# Patient Record
Sex: Female | Born: 1980 | Race: Black or African American | Hispanic: No | Marital: Married | Smoking: Never smoker
Health system: Southern US, Community
[De-identification: ages and names within clinical notes are randomized; demographics above are authoritative.]

## PROBLEM LIST (undated history)

## (undated) DIAGNOSIS — L900 Lichen sclerosus et atrophicus: Secondary | ICD-10-CM

## (undated) DIAGNOSIS — R87619 Unspecified abnormal cytological findings in specimens from cervix uteri: Secondary | ICD-10-CM

## (undated) DIAGNOSIS — F419 Anxiety disorder, unspecified: Secondary | ICD-10-CM

## (undated) DIAGNOSIS — O2240 Hemorrhoids in pregnancy, unspecified trimester: Secondary | ICD-10-CM

## (undated) DIAGNOSIS — D219 Benign neoplasm of connective and other soft tissue, unspecified: Secondary | ICD-10-CM

## (undated) HISTORY — DX: Hemorrhoids in pregnancy, unspecified trimester: O22.40

## (undated) HISTORY — DX: Unspecified abnormal cytological findings in specimens from cervix uteri: R87.619

## (undated) HISTORY — DX: Anxiety disorder, unspecified: F41.9

## (undated) HISTORY — DX: Lichen sclerosus et atrophicus: L90.0

## (undated) HISTORY — DX: Benign neoplasm of connective and other soft tissue, unspecified: D21.9

---
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
## 2012-04-20 ENCOUNTER — Other Ambulatory Visit: Payer: Self-pay | Admitting: Certified Nurse Midwife

## 2012-04-20 DIAGNOSIS — R87619 Unspecified abnormal cytological findings in specimens from cervix uteri: Secondary | ICD-10-CM

## 2012-04-20 HISTORY — DX: Unspecified abnormal cytological findings in specimens from cervix uteri: R87.619

## 2012-04-26 ENCOUNTER — Telehealth: Payer: Self-pay | Admitting: *Deleted

## 2012-04-26 NOTE — Telephone Encounter (Signed)
See pap result prior to EPIC.  Colpo recommended by D. Darcel Bayley.  Call to patient and left message to call back.

## 2012-05-07 ENCOUNTER — Telehealth: Payer: Self-pay | Admitting: *Deleted

## 2012-05-07 NOTE — Telephone Encounter (Signed)
2nd call to patient with lab results.  LMTCB/sy

## 2012-05-08 NOTE — Telephone Encounter (Signed)
3rd call to patient for lab results, VM confirms correct number. LM calling about lab results and to please call.

## 2012-05-10 NOTE — Telephone Encounter (Signed)
Patient notified of lab results and Pap results.  Explained Debbi's recommendation for colposcopy.  Patient just finished menses and is on Nuvaring.  Colpo scheduled for 05-15-12.  Instructed to take Motrin 800 mg 1 hour prior with food.

## 2012-05-10 NOTE — Telephone Encounter (Signed)
agree

## 2012-05-14 ENCOUNTER — Encounter: Payer: Self-pay | Admitting: Certified Nurse Midwife

## 2012-05-15 ENCOUNTER — Encounter: Payer: Self-pay | Admitting: Certified Nurse Midwife

## 2012-05-15 ENCOUNTER — Ambulatory Visit (INDEPENDENT_AMBULATORY_CARE_PROVIDER_SITE_OTHER): Payer: BC Managed Care – PPO | Admitting: Certified Nurse Midwife

## 2012-05-15 VITALS — BP 100/60

## 2012-05-15 DIAGNOSIS — R87610 Atypical squamous cells of undetermined significance on cytologic smear of cervix (ASC-US): Secondary | ICD-10-CM

## 2012-05-15 DIAGNOSIS — Z308 Encounter for other contraceptive management: Secondary | ICD-10-CM

## 2012-05-15 DIAGNOSIS — R87619 Unspecified abnormal cytological findings in specimens from cervix uteri: Secondary | ICD-10-CM

## 2012-05-15 DIAGNOSIS — Z789 Other specified health status: Secondary | ICD-10-CM

## 2012-05-15 NOTE — Progress Notes (Signed)
Reviewed, TL colposcopy

## 2012-05-15 NOTE — Patient Instructions (Addendum)

## 2012-05-15 NOTE — Procedures (Signed)
32 y.o. DivorcedAfrican Americanfemale  OB History   Grav Para Term Preterm Abortions TAB SAB Ect Mult Living   2    2 2     0     here for colposcopy. 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  Contraception:  NuvaRing vaginal inserts   Blood pressure 100/60, last menstrual period 04/30/2012.  Procedure explained and patient's questions were invited and answered.  Consent form signed.    Role of HPV in genesis of SIL discussed with patient, and questions answered.   Speculum inserted atraumatically and cervix visualized. Saline wash applied to cervix and vagina,  3% acetic acid applied.  Cervix examined using 3.75,7.5,15  X magnification and green filter. No acetowhite appearance noted. Lugol's applied to cervix and vaginal area, all areas took stain, no lesions noted.     Gross appearance:normal  Squamocolumnar junction seen in entirety: yes  Extent of lesion entirely seen: no lesion seen   no visible lesions and no abnormal vasculature, endocervical curettage performed, specimen labelled and sent to pathology and hemostasis achieved with Monsel's solution  Patient tolerated procedure well.   Plan:  Will base further treatment on Pathology findings.  Post biopsy instructions, questions answered and AVS given to patient.

## 2012-05-17 ENCOUNTER — Encounter: Payer: Self-pay | Admitting: Certified Nurse Midwife

## 2012-05-21 ENCOUNTER — Telehealth: Payer: Self-pay

## 2012-05-21 NOTE — Telephone Encounter (Signed)
Calling to give patient pathology results from colposcopy

## 2012-05-29 ENCOUNTER — Encounter: Payer: Self-pay | Admitting: Certified Nurse Midwife

## 2012-05-29 NOTE — Telephone Encounter (Signed)
Left message for call back.

## 2012-05-29 NOTE — Telephone Encounter (Signed)
[phone] (M) call mobile number for patient

## 2012-05-29 NOTE — Telephone Encounter (Signed)
Patient notified that ecc showed chronic inflammation, no atypical cells. Repeat pap with cotesting in 1 yr. Patient in recall for pap

## 2013-04-23 ENCOUNTER — Ambulatory Visit (INDEPENDENT_AMBULATORY_CARE_PROVIDER_SITE_OTHER): Payer: BC Managed Care – PPO | Admitting: Certified Nurse Midwife

## 2013-04-23 ENCOUNTER — Encounter: Payer: Self-pay | Admitting: Certified Nurse Midwife

## 2013-04-23 VITALS — BP 124/80 | HR 78 | Resp 16 | Ht 64.0 in | Wt 151.0 lb

## 2013-04-23 DIAGNOSIS — N870 Mild cervical dysplasia: Secondary | ICD-10-CM

## 2013-04-23 DIAGNOSIS — Z0000 Encounter for general adult medical examination without abnormal findings: Secondary | ICD-10-CM

## 2013-04-23 DIAGNOSIS — Z309 Encounter for contraceptive management, unspecified: Secondary | ICD-10-CM

## 2013-04-23 DIAGNOSIS — Z01419 Encounter for gynecological examination (general) (routine) without abnormal findings: Secondary | ICD-10-CM

## 2013-04-23 LAB — POCT URINALYSIS DIPSTICK
BILIRUBIN UA: NEGATIVE
Blood, UA: NEGATIVE
GLUCOSE UA: NEGATIVE
Ketones, UA: NEGATIVE
LEUKOCYTES UA: NEGATIVE
NITRITE UA: NEGATIVE
Protein, UA: NEGATIVE
UROBILINOGEN UA: NEGATIVE
pH, UA: 5

## 2013-04-23 LAB — HEMOGLOBIN, FINGERSTICK: Hemoglobin, fingerstick: 12.4 g/dL (ref 12.0–16.0)

## 2013-04-23 MED ORDER — ETONOGESTREL-ETHINYL ESTRADIOL 0.12-0.015 MG/24HR VA RING: VAGINAL_RING | VAGINAL | Status: DC

## 2013-04-23 NOTE — Patient Instructions (Signed)
General topics  Next pap or exam is  due in 1 year Take a Women's multivitamin Take 1200 mg. of calcium daily - prefer dietary If any concerns in interim to call back  Breast Self-Awareness Practicing breast self-awareness may pick up problems early, prevent significant medical complications, and possibly save your life. By practicing breast self-awareness, you can become familiar with how your breasts look and feel and if your breasts are changing. This allows you to notice changes early. It can also offer you some reassurance that your breast health is good. One way to learn what is normal for your breasts and whether your breasts are changing is to do a breast self-exam. If you find a lump or something that was not present in the past, it is best to contact your caregiver right away. Other findings that should be evaluated by your caregiver include nipple discharge, especially if it is bloody; skin changes or reddening; areas where the skin seems to be pulled in (retracted); or new lumps and bumps. Breast pain is seldom associated with cancer (malignancy), but should also be evaluated by a caregiver. BREAST SELF-EXAM The best time to examine your breasts is 5 7 days after your menstrual period is over.  ExitCare Patient Information 2013 [employer].   Exercise to Stay Healthy Exercise helps you become and stay healthy. EXERCISE IDEAS AND TIPS Choose exercises that:  You enjoy.  Fit into your day. You do not need to exercise really hard to be healthy. You can do exercises at a slow or medium level and stay healthy. You can:  Stretch before and after working out.  Try yoga, Pilates, or tai chi.  Lift weights.  Walk fast, swim, jog, run, climb stairs, bicycle, dance, or rollerskate.  Take aerobic classes. Exercises that burn about 150 calories:  Running 1  miles in 15 minutes.  Playing volleyball for 45 to 60 minutes.  Washing and waxing a car for 45 to 60  minutes.  Playing touch football for 45 minutes.  Walking 1  miles in 35 minutes.  Pushing a stroller 1  miles in 30 minutes.  Playing basketball for 30 minutes.  Raking leaves for 30 minutes.  Bicycling 5 miles in 30 minutes.  Walking 2 miles in 30 minutes.  Dancing for 30 minutes.  Shoveling snow for 15 minutes.  Swimming laps for 20 minutes.  Walking up stairs for 15 minutes.  Bicycling 4 miles in 15 minutes.  Gardening for 30 to 45 minutes.  Jumping rope for 15 minutes.  Washing windows or floors for 45 to 60 minutes. Document Released: 02/26/2010 Document Revised: 04/18/2011 Document Reviewed: 02/26/2010 ExitCare Patient Information 2013 [employer].   Other topics ( that may be useful information):    Sexually Transmitted Disease Sexually transmitted disease (STD) refers to any infection that is passed from person to person during sexual activity. This may happen by way of saliva, semen, blood, vaginal mucus, or urine. Common STDs include:  Gonorrhea.  Chlamydia.  Syphilis.  HIV/AIDS.  Genital herpes.  Hepatitis B and C.  Trichomonas.  Human papillomavirus (HPV).  Pubic lice. CAUSES  An STD may be spread by bacteria, virus, or parasite. A person can get an STD by:  Sexual intercourse with an infected person.  Sharing sex toys with an infected person.  Sharing needles with an infected person.  Having intimate contact with the genitals, mouth, or rectal areas of an infected person. SYMPTOMS  Some people may not have any symptoms, but   they can still pass the infection to others. Different STDs have different symptoms. Symptoms include:  Painful or bloody urination.  Pain in the pelvis, abdomen, vagina, anus, throat, or eyes.  Skin rash, itching, irritation, growths, or sores (lesions). These usually occur in the genital or anal area.  Abnormal vaginal discharge.  Penile discharge in men.  Soft, flesh-colored skin growths in the  genital or anal area.  Fever.  Pain or bleeding during sexual intercourse.  Swollen glands in the groin area.  Yellow skin and eyes (jaundice). This is seen with hepatitis. DIAGNOSIS  To make a diagnosis, your caregiver may:  Take a medical history.  Perform a physical exam.  Take a specimen (culture) to be examined.  Examine a sample of discharge under a microscope.  Perform blood test TREATMENT   Chlamydia, gonorrhea, trichomonas, and syphilis can be cured with antibiotic medicine.  Genital herpes, hepatitis, and HIV can be treated, but not cured, with prescribed medicines. The medicines will lessen the symptoms.  Genital warts from HPV can be treated with medicine or by freezing, burning (electrocautery), or surgery. Warts may come back.  HPV is a virus and cannot be cured with medicine or surgery.However, abnormal areas may be followed very closely by your caregiver and may be removed from the cervix, vagina, or vulva through office procedures or surgery. If your diagnosis is confirmed, your recent sexual partners need treatment. This is true even if they are symptom-free or have a negative culture or evaluation. They should not have sex until their caregiver says it is okay. HOME CARE INSTRUCTIONS  All sexual partners should be informed, tested, and treated for all STDs.  Take your antibiotics as directed. Finish them even if you start to feel better.  Only take over-the-counter or prescription medicines for pain, discomfort, or fever as directed by your caregiver.  Rest.  Eat a balanced diet and drink enough fluids to keep your urine clear or pale yellow.  Do not have sex until treatment is completed and you have followed up with your caregiver. STDs should be checked after treatment.  Keep all follow-up appointments, Pap tests, and blood tests as directed by your caregiver.  Only use latex condoms and water-soluble lubricants during sexual activity. Do not use  petroleum jelly or oils.  Avoid alcohol and illegal drugs.  Get vaccinated for HPV and hepatitis. If you have not received these vaccines in the past, talk to your caregiver about whether one or both might be right for you.  Avoid risky sex practices that can break the skin. The only way to avoid getting an STD is to avoid all sexual activity.Latex condoms and dental dams (for oral sex) will help lessen the risk of getting an STD, but will not completely eliminate the risk. SEEK MEDICAL CARE IF:   You have a fever.  You have any new or worsening symptoms. Document Released: 04/16/2002 Document Revised: 04/18/2011 Document Reviewed: 04/23/2010 [facility] Patient Information 2013 [place].    Domestic Abuse You are being battered or abused if someone close to you hits, pushes, or physically hurts you in any way. You also are being abused if you are forced into activities. You are being sexually abused if you are forced to have sexual contact of any kind. You are being emotionally abused if you are made to feel worthless or if you are constantly threatened. It is important to remember that help is available. No one has the right to abuse you. PREVENTION OF FURTHER  ABUSE  Learn the warning signs of danger. This varies with situations but may include: the use of alcohol, threats, isolation from friends and family, or forced sexual contact. Leave if you feel that violence is going to occur.  If you are attacked or beaten, report it to the police so the abuse is documented. You do not have to press charges. The police can protect you while you or the attackers are leaving. Get the officer's name and badge number and a copy of the report.  Find someone you can trust and tell them what is happening to you: your caregiver, a nurse, clergy member, close friend or family member. Feeling ashamed is natural, but remember that you have done nothing wrong. No one deserves abuse. Document Released:  01/22/2000 Document Revised: 04/18/2011 Document Reviewed: 04/01/2010 ExitCare Patient Information 2013 [employer].    How Much is Too Much Alcohol? Drinking too much alcohol can cause injury, accidents, and health problems. These types of problems can include:   Car crashes.  Falls.  Family fighting (domestic violence).  Drowning.  Fights.  Injuries.  Burns.  Damage to certain organs.  Having a baby with birth defects. ONE DRINK CAN BE TOO MUCH WHEN YOU ARE:  Working.  Pregnant or breastfeeding.  Taking medicines. Ask your doctor.  Driving or planning to drive. If you or someone you know has a drinking problem, get help from a doctor.  Document Released: 11/20/2008 Document Revised: 04/18/2011 Document Reviewed: 11/20/2008 ExitCare Patient Information 2013 [employer].   Smoking Hazards Smoking cigarettes is extremely bad for your health. Tobacco smoke has over 200 known poisons in it. There are over 60 chemicals in tobacco smoke that cause cancer. Some of the chemicals found in cigarette smoke include:   Cyanide.  Benzene.  Formaldehyde.  Methanol (wood alcohol).  Acetylene (fuel used in welding torches).  Ammonia. Cigarette smoke also contains the poisonous gases nitrogen oxide and carbon monoxide.  Cigarette smokers have an increased risk of many serious medical problems and Smoking causes approximately:  90% of all lung cancer deaths in men.  80% of all lung cancer deaths in women.  90% of deaths from chronic obstructive lung disease. Compared with nonsmokers, smoking increases the risk of:  Coronary heart disease by 2 to 4 times.  Stroke by 2 to 4 times.  Men developing lung cancer by 23 times.  Women developing lung cancer by 13 times.  Dying from chronic obstructive lung diseases by 12 times.  . Smoking is the most preventable cause of death and disease in our society.  WHY IS SMOKING ADDICTIVE?  Nicotine is the chemical  agent in tobacco that is capable of causing addiction or dependence.  When you smoke and inhale, nicotine is absorbed rapidly into the bloodstream through your lungs. Nicotine absorbed through the lungs is capable of creating a powerful addiction. Both inhaled and non-inhaled nicotine may be addictive.  Addiction studies of cigarettes and spit tobacco show that addiction to nicotine occurs mainly during the teen years, when young people begin using tobacco products. WHAT ARE THE BENEFITS OF QUITTING?  There are many health benefits to quitting smoking.   Likelihood of developing cancer and heart disease decreases. Health improvements are seen almost immediately.  Blood pressure, pulse rate, and breathing patterns start returning to normal soon after quitting. QUITTING SMOKING   American Lung Association - 1-800-LUNGUSA  American Cancer Society - 1-800-ACS-2345 Document Released: 03/03/2004 Document Revised: 04/18/2011 Document Reviewed: 11/05/2008 ExitCare Patient Information 2013 ExitCare[employer]   LL[employer].   Stress Management Stress is a state of physical or mental tension that often results from changes in your life or normal routine. Some common causes of stress are:  Death of a loved one.  Injuries or severe illnesses.  Getting fired or changing jobs.  Moving into a new home. Other causes may be:  Sexual problems.  Business or financial losses.  Taking on a large debt.  Regular conflict with someone at home or at work.  Constant tiredness from lack of sleep. It is not just bad things that are stressful. It may be stressful to:  Win the lottery.  Get married.  Buy a new car. The amount of stress that can be easily tolerated varies from person to person. Changes generally cause stress, regardless of the types of change. Too much stress can affect your health. It may lead to physical or emotional problems. Too little stress (boredom) may also become stressful. SUGGESTIONS TO  REDUCE STRESS:  Talk things over with your family and friends. It often is helpful to share your concerns and worries. If you feel your problem is serious, you may want to get help from a professional counselor.  Consider your problems one at a time instead of lumping them all together. Trying to take care of everything at once may seem impossible. List all the things you need to do and then start with the most important one. Set a goal to accomplish 2 or 3 things each day. If you expect to do too many in a single day you will naturally fail, causing you to feel even more stressed.  Do not use alcohol or drugs to relieve stress. Although you may feel better for a short time, they do not remove the problems that caused the stress. They can also be habit forming.  Exercise regularly - at least 3 times per week. Physical exercise can help to relieve that "uptight" feeling and will relax you.  The shortest distance between despair and hope is often a good night's sleep.  Go to bed and get up on time allowing yourself time for appointments without being rushed.  Take a short "time-out" period from any stressful situation that occurs during the day. Close your eyes and take some deep breaths. Starting with the muscles in your face, tense them, hold it for a few seconds, then relax. Repeat this with the muscles in your neck, shoulders, hand, stomach, back and legs.  Take good care of yourself. Eat a balanced diet and get plenty of rest.  Schedule time for having fun. Take a break from your daily routine to relax. HOME CARE INSTRUCTIONS   Call if you feel overwhelmed by your problems and feel you can no longer manage them on your own.  Return immediately if you feel like hurting yourself or someone else. Document Released: 07/20/2000 Document Revised: 04/18/2011 Document Reviewed: 03/12/2007 [facility] Patient Information 2013 [place].    It was good to see you today. Enjoy your trip to  [place]!! Debbi

## 2013-04-23 NOTE — Progress Notes (Signed)
33 y.o. G2P0020 Divorced African American Fe here for annual exam.Periods normal, no issues. Contraception working well. No partner change, no STD screening needed. No health issues today. Sees Urgent care if needed.     Patient's last menstrual period was 04/17/2013.          Sexually active: yes  The current method of family planning is NuvaRing vaginal inserts.    Exercising: no  exercise Smoker:  no  Health Maintenance: Pap:  04-20-12 ASCUS +HPV, colpo 05-15-12 ecc chronic inflammation MMG: none Colonoscopy:  none BMD:   none TDaP: unsure ? Up to date Labs: Poct urine-neg, Hgb-12.4 Self breast exam: done monthly   reports that she has never smoked. She does not have any smokeless tobacco history on file. She reports that she does not drink alcohol or use illicit drugs.  Past Medical History  Diagnosis Date  . Abnormal Pap smear of cervix 04-20-12    ASCUS +HPV HR    Past Surgical History  Procedure Laterality Date  . Colposcopy  05-15-13    ecc showed chronic inflammation    Current Outpatient Prescriptions  Medication Sig Dispense Refill  . BIOTIN PO Take by mouth daily.       . Cetirizine HCl (ZYRTEC PO) Take by mouth daily.       Marland Kitchen etonogestrel-ethinyl estradiol (NUVARING) 0.12-0.015 MG/24HR vaginal ring Place 1 each vaginally every 28 (twenty-eight) days. Insert vaginally and leave in place for 3 consecutive weeks, then remove for 1 week.       No current facility-administered medications for this visit.    Family History  Problem Relation Age of Onset  . Breast cancer Mother 15  . Diabetes Mother   . Hypertension Father   . Aneurysm Father   . Diabetes Paternal Grandmother   . Stroke Paternal Grandmother     ROS:  Pertinent items are noted in HPI.  Otherwise, a comprehensive ROS was negative.  Exam:   BP 124/80  Pulse 78  Resp 16  Ht 5' 1.25" (1.556 m)  Wt 151 lb (68.493 kg)  BMI 28.29 kg/m2  LMP 04/17/2013 Height: 5' 1.25" (155.6 cm)  Ht Readings from  Last 3 Encounters:  04/23/13 5' 1.25" (1.556 m)    General appearance: alert, cooperative and appears stated age Head: Normocephalic, without obvious abnormality, atraumatic Neck: no adenopathy, supple, symmetrical, trachea midline and thyroid normal to inspection and palpation and non-palpable Lungs: clear to auscultation bilaterally Breasts: normal appearance, no masses or tenderness, No nipple retraction or dimpling, No nipple discharge or bleeding, No axillary or supraclavicular adenopathy Heart: regular rate and rhythm Abdomen: soft, non-tender; no masses,  no organomegaly Extremities: extremities normal, atraumatic, no cyanosis or edema Skin: Skin color, texture, turgor normal. No rashes or lesions Lymph nodes: Cervical, supraclavicular, and axillary nodes normal. No abnormal inguinal nodes palpated Neurologic: Grossly normal   Pelvic: External genitalia:  no lesions              Urethra:  normal appearing urethra with no masses, tenderness or lesions              Bartholin's and Skene's: normal                 Vagina: normal appearing vagina with normal color and discharge, no lesions              Cervix: normal appearance, non tender              Pap taken: yes Bimanual  Exam:  Uterus:  normal size, contour, position, consistency, mobility, non-tender and anteverted              Adnexa: normal adnexa and no mass, fullness, tenderness               Rectovaginal: Confirms               Anus:  normal sphincter tone, no lesions  A:  Well Woman with normal exam  Contraception Nuvaring desired  History of abnormal pap ASCUS, Colpo CIN1  P:   Reviewed health and wellness pertinent to exam  Rx Nuvaring see order  Pap smear as per guidelines   pap smear taken today with HPVHR if negative repeat pap in one year, otherwise manage per results  counseled on breast self exam, STD prevention, HIV risk factors and prevention, adequate intake of calcium and vitamin D, diet and  exercise  return annually or prn  An After Visit Summary was printed and given to the patient.

## 2013-04-26 LAB — IPS PAP TEST WITH HPV

## 2013-04-26 NOTE — Progress Notes (Signed)
Reviewed personally.  M. Suzanne Shanyia Stines, MD.  

## 2013-04-29 ENCOUNTER — Telehealth: Payer: Self-pay | Admitting: Certified Nurse Midwife

## 2013-04-29 ENCOUNTER — Other Ambulatory Visit: Payer: Self-pay | Admitting: Certified Nurse Midwife

## 2013-04-29 DIAGNOSIS — R87610 Atypical squamous cells of undetermined significance on cytologic smear of cervix (ASC-US): Secondary | ICD-10-CM

## 2013-04-29 DIAGNOSIS — R87810 Cervical high risk human papillomavirus (HPV) DNA test positive: Principal | ICD-10-CM

## 2013-04-29 NOTE — Telephone Encounter (Signed)
Message copied by Jasmine Awe on Mon Apr 29, 2013 10:46 AM ------      Message from: Regina Eck      Created: Mon Apr 29, 2013  8:11 AM       Notify patient pap smear showing same, has not cleared . ASCUS + HPVHR She will need another colposcopy, as we discussed would be the case if still abnormal.      Order in ------

## 2013-04-29 NOTE — Telephone Encounter (Signed)
Patient returning Kaitlyn's call. °

## 2013-04-29 NOTE — Telephone Encounter (Signed)
Spoke with patient. Message from Debbi given. Patient agreeable to schedule colpo at this time. Colpo scheduled for 3/31 at 1400 with Regina Eck CNM. Patient currently on birth control, LMP was on 3/11. Colposcopy pre-procedure instructions given. Motrin instructions given. Motrin=Advil=Ibuprofen Can take 800 mg (Can purchase over the counter, you will need four 200 mg pills) every 8 hours as needed.  Take with food. Make sure to eat a meal before appointment and drink plenty of fluids. Patient verbalized understanding and will call to reschedule if will be on menses or has any concerns regarding pregnancy. Patient benefits discussed. Patient agreeable and verbalizes understanding.   Routing to provider for final review. Patient agreeable to disposition. Will close encounter

## 2013-04-29 NOTE — Telephone Encounter (Signed)
LMTCB 3/23 °

## 2013-05-07 ENCOUNTER — Ambulatory Visit (INDEPENDENT_AMBULATORY_CARE_PROVIDER_SITE_OTHER): Payer: BC Managed Care – PPO | Admitting: Certified Nurse Midwife

## 2013-05-07 ENCOUNTER — Encounter: Payer: Self-pay | Admitting: Certified Nurse Midwife

## 2013-05-07 VITALS — BP 130/72 | HR 88 | Resp 16 | Ht 64.0 in | Wt 153.0 lb

## 2013-05-07 DIAGNOSIS — R87610 Atypical squamous cells of undetermined significance on cytologic smear of cervix (ASC-US): Secondary | ICD-10-CM

## 2013-05-07 DIAGNOSIS — R87810 Cervical high risk human papillomavirus (HPV) DNA test positive: Secondary | ICD-10-CM

## 2013-05-07 NOTE — Progress Notes (Addendum)
Patient ID: Cassandra Terrell, female   DOB: 1980/11/03, 33 y.o.   MRN: [id number]  Chief Complaint  Patient presents with  . Procedure    HPI Cassandra Terrell is a 33 y.o. divorced african [place] female.  [id number] her for colposcopy.   HPI Patient denies vaginal bleeding or vaginal pain.  Indications: Pap smear on 04/23/13 showed: ASCUS with POSITIVE high risk HPV. Previous colposcopy: ECC chronic inflammation in 06/04/12. Prior cervical treatment: none.  Past Medical History  Diagnosis Date  . Abnormal Pap smear of cervix 04-20-12    ASCUS +HPV HR    Past Surgical History  Procedure Laterality Date  . Colposcopy  05-15-13    ecc showed chronic inflammation    Family History  Problem Relation Age of Onset  . Breast cancer Mother 36  . Diabetes Mother   . Hypertension Father   . Aneurysm Father   . Diabetes Paternal Grandmother   . Stroke Paternal Grandmother     Social History History  Substance Use Topics  . Smoking status: Never Smoker   . Smokeless tobacco: Never Used  . Alcohol Use: No    No Known Allergies  Current Outpatient Prescriptions  Medication Sig Dispense Refill  . BIOTIN PO Take by mouth daily.       . Cetirizine HCl (ZYRTEC PO) Take by mouth daily.       Marland Kitchen etonogestrel-ethinyl estradiol (NUVARING) 0.12-0.015 MG/24HR vaginal ring Insert vaginally and leave in place for 3 consecutive weeks, then remove for 1 week.  1 each  12   No current facility-administered medications for this visit.    Review of Systems Review of Systems  Constitutional: Negative.   Genitourinary: Negative for vaginal bleeding, vaginal discharge and vaginal pain.    Blood pressure 130/72, pulse 88, resp. rate 16, height 5\' 4"  (1.626 m), weight 153 lb (69.4 kg), last menstrual period 04/17/2013.  Physical Exam Physical Exam  Constitutional: She is oriented to person, place, and time. She appears well-developed and well-nourished.  Genitourinary: Vagina normal. No tenderness  around the vagina. No vaginal discharge found.    Neurological: She is alert and oriented to person, place, and time.  Skin: Skin is warm and dry.  Psychiatric: She has a normal mood and affect. Judgment normal.    Data Reviewed Reviewed pap smear results and questions addressed.  Assessment    Procedure Details  The risks and benefits of the procedure and Written informed consent obtained.  Speculum placed in vagina and excellent visualization of cervix achieved, cervix swabbed x 3 with saline solution and acetic acid solution. Cervix visualized with 3.75,7.5,15 # and green filter. Acetowhite area noted at 11 o'clock and inside cervical os. Lugol's applied with non staining noted in same area. Biopsy taken at 11 o'clock and ECC obtained. Monsel's applied. No active bleeding noted on speculum removal. Patient tolerated procedure well. Instructions given.  Specimens: 2  Complications: none.     Plan    Specimens labelled and sent to Pathology. Patient will be notified of results after pathology reviewed. Patient given handout on folic acid food sources which have been thought to improve immune response.    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 Pap recall    [facility] 05/07/2013, 2:11 PM

## 2013-05-07 NOTE — Progress Notes (Signed)
Reviewed personally.  M. Suzanne Itzabella Sorrels, MD.  

## 2013-05-10 LAB — IPS OTHER TISSUE BIOPSY

## 2013-05-15 HISTORY — PX: COLPOSCOPY: SHX161

## 2013-05-17 ENCOUNTER — Telehealth: Payer: Self-pay | Admitting: *Deleted

## 2013-05-17 NOTE — Telephone Encounter (Signed)
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 Work on folic acid in diet as discussed.      Very important she have repeat pap smear in one year.      Pap recall  08 ------

## 2013-05-17 NOTE — Telephone Encounter (Signed)
Call to patient, VM confirms number.  LMTCB, no emergnecy.

## 2013-05-20 NOTE — Telephone Encounter (Signed)
Patient notified of all information from Dr Quincy Simmonds.  Stressed importance of follow-up next year to ensure pap is the same or better and not getting worse. Patient already has AEX scheduled for 04-28-14. 08 Recall added for 05-07-14.  Encounter closed.

## 2013-12-09 ENCOUNTER — Encounter: Payer: Self-pay | Admitting: Certified Nurse Midwife

## 2014-02-07 NOTE — L&D Delivery Note (Signed)
Delivery Note At  a non-viable unspecified sex was delivered via  (Presentation: OA ).  APGAR: 0, 0; weight  na   Placenta status: spontaneous, intact.  Cord:  with the following complications: none.  Cord pH: na  Anesthesia: Epidural  Episiotomy:  none Lacerations:  none Suture Repair: na Est. Blood Loss (mL):  200  Mom to home.  Baby to [place].  Henryetta Wenzlick J 07/25/2014, 1:31 AM

## 2014-02-26 ENCOUNTER — Telehealth: Payer: Self-pay | Admitting: Certified Nurse Midwife

## 2014-02-26 NOTE — Telephone Encounter (Signed)
Spoke with patient. Patient has been having clear vaginal discharge with odor for 2 weeks. Denies any discomfort, vaginal itching, redness, or swelling. No appointments available with Regina Eck CNM tomorrow or Friday as she will be out of the office. Appointment scheduled for 1/21 at 11:15am with Milford Cage, FNP. Agreeable to date and time and to see another provider.  Cc: Milford Cage, FNP   Routing to provider for final review. Patient agreeable to disposition. Will close encounter

## 2014-02-26 NOTE — Telephone Encounter (Signed)
Patient calling requesting an appointment with Johny Shock, CNM early tomorrow or Friday for "discharge."

## 2014-02-27 ENCOUNTER — Encounter: Payer: Self-pay | Admitting: Nurse Practitioner

## 2014-02-27 ENCOUNTER — Ambulatory Visit (INDEPENDENT_AMBULATORY_CARE_PROVIDER_SITE_OTHER): Payer: BC Managed Care – PPO | Admitting: Nurse Practitioner

## 2014-02-27 VITALS — BP 110/76 | HR 70 | Ht 64.0 in | Wt 153.6 lb

## 2014-02-27 DIAGNOSIS — N912 Amenorrhea, unspecified: Secondary | ICD-10-CM

## 2014-02-27 DIAGNOSIS — N898 Other specified noninflammatory disorders of vagina: Secondary | ICD-10-CM

## 2014-02-27 LAB — POCT URINE PREGNANCY: Preg Test, Ur: POSITIVE

## 2014-02-27 NOTE — Progress Notes (Signed)
34 y.o. DAA Fe G2P0 here with complaint of vaginal symptoms of increase in discharge for 2 weeks.  No itching or burning.  Maybe a little odor.  Describes discharge as thin and clear.   Denies new personal products or vaginal dryness.  She has been dating someone for 6 months.   Just became SA recently. No  STD concerns. Urinary symptoms none.    Nuva Ring for [facility] without incident.   LMP 01/07/14 with no menses first of January.  Did not reinsert a new Ring when she did not have a regular cycle.  Using withdrawal for birthcontrol occasionally since then.  Denies vaginal bleeding, pain or cramps.  She does have breast tenderness and fatigue.   O:Healthy female WDWN Affect: normal, orientation x 3  Exam: no distress Abdomen: soft and non tender Lymph node: no enlargement or tenderness Pelvic exam: External genital:  BUS: negative Vagina: thin vaginal discharge Cervix: normal, non tender Uterus: normal, non tender Adnexa:normal, non tender, no masses or fullness noted  Affirm: test is done UPT: positive   A:   Amenorrhea with + UPT  Vaginal discharge   P: Discussed findings of + UPT.   Will place an order for PUS for confirmation  She will start on Prenatal MVI  Will also await the results of affirm testing before any treatment since she is pregnant.  RV prn

## 2014-02-28 ENCOUNTER — Telehealth: Payer: Self-pay | Admitting: Nurse Practitioner

## 2014-02-28 LAB — WET PREP BY MOLECULAR PROBE
Candida species: NEGATIVE
Gardnerella vaginalis: POSITIVE — AB
TRICHOMONAS VAG: NEGATIVE

## 2014-02-28 NOTE — Telephone Encounter (Signed)
Patient is informed that Affirm test is positive for BV,  Since she is pregnant we are unable to treat her with Flagyl or Metrogel.  If she has any vulvar discomfort she may use Aveeno baths.  Since she has no symptoms now does not need to do anything. She will be informed of PUS apt. next week.

## 2014-03-02 NOTE — Progress Notes (Signed)
Reviewed personally.  M. Suzanne Jamerion Cabello, MD.  

## 2014-03-04 ENCOUNTER — Other Ambulatory Visit: Payer: Self-pay

## 2014-03-04 DIAGNOSIS — O3680X0 Pregnancy with inconclusive fetal viability, not applicable or unspecified: Secondary | ICD-10-CM

## 2014-03-06 ENCOUNTER — Ambulatory Visit (INDEPENDENT_AMBULATORY_CARE_PROVIDER_SITE_OTHER): Payer: BC Managed Care – PPO

## 2014-03-06 ENCOUNTER — Other Ambulatory Visit: Payer: Self-pay | Admitting: Obstetrics & Gynecology

## 2014-03-06 ENCOUNTER — Ambulatory Visit (INDEPENDENT_AMBULATORY_CARE_PROVIDER_SITE_OTHER): Payer: BC Managed Care – PPO | Admitting: Obstetrics & Gynecology

## 2014-03-06 VITALS — BP 118/68 | Ht 64.0 in | Wt 154.0 lb

## 2014-03-06 DIAGNOSIS — N912 Amenorrhea, unspecified: Secondary | ICD-10-CM

## 2014-03-06 DIAGNOSIS — N8320 Unspecified ovarian cysts: Secondary | ICD-10-CM

## 2014-03-06 DIAGNOSIS — D251 Intramural leiomyoma of uterus: Secondary | ICD-10-CM

## 2014-03-06 DIAGNOSIS — O3680X0 Pregnancy with inconclusive fetal viability, not applicable or unspecified: Secondary | ICD-10-CM

## 2014-03-06 DIAGNOSIS — N83202 Unspecified ovarian cyst, left side: Secondary | ICD-10-CM

## 2014-03-06 LAB — US OB TRANSVAGINAL

## 2014-03-09 NOTE — Progress Notes (Signed)
34 y.o. G3TAB2 Divorced AA female here for a pelvic ultrasound for viability.  LMP 01/07/14.  EDC based on this is 10/14/14.  EGA 8 2/7 weeks today.  Feeling well.  Does report some breast tenderness and fatigue.  Denies nausea.  Really excited.  Is alone for office visit today.  FINDINGS: UTERUS: singleton IUD noted with CRL of 1.45cm.  This is consistent with ECA of 7 6/7 weeks.  This is consistent with dating by LMP.  Normal appearing gestational sac, yolk sac, fetal pole.  FCA 161 BPM. Also noted are three fibroids measuring 6.1cm, 1.0c, and 3.4cm.  Largest is located posteriorly and not near endometrial cavity. ADNEXA:   Left ovary 3.9 x 3.1 x 2.5cm with small corpus luteal cyst on left side measuring 70mm.   Right ovary 3.4 x 1.8 x 1.9cm CUL DE SAC: no free fluid ntoed  Findings discussed with pt.  She was unaware of presence of fibroids.  Large fibroid is not close to endometrial cavity, although increased risk of miscarriage due to fibroids was dicussed.  Thus far, pregnancy appears to be progressing normally.    Reviewed her current medications--only on PNV.  Pt report there are no cats in the home.  Tdap--pt unsure.  Aware safe to receive in pregnancy.  Had chicken pox.  Aware flu vaccine safe and recommended.   Fish/shellfish/mercury discuss.  Patient does not eat fish.  Unpasteurized cheese/juices discussed.  Nitrites in foods disucssed.  Exercise and intercourse discussed.  Fetal DNA particle testing discussed.  First trimester down's testing discussed.  Cystic fibrosis discussed.  Sickle cell testing discussed.  Pt does have sickle trait and partner has been tested and is negative for carrier status.  Post natal testing discussed.  All questions answered.   Assessment:  Viable singleton IUD at 8 2/7 weeks.  U/S consistent with dating. Uterine fibroids--largest is 6 cm Small left corpus luteal cyst Pt with sickle cell trait  Plan: Transfer to [place] office discussed.  Names/practices  suggested.  All questions answered.  Pt is on PNV and encouraged to continue.  ~25 minutes spent with patient >50% of time was in face to face discussion of above.

## 2014-03-12 ENCOUNTER — Encounter: Payer: Self-pay | Admitting: Obstetrics & Gynecology

## 2014-03-18 ENCOUNTER — Telehealth: Payer: Self-pay | Admitting: Obstetrics & Gynecology

## 2014-03-18 NOTE — Telephone Encounter (Signed)
Pt recently confirmed as pregnant. States she is experiencing bleeding. Experienced this morning. Cramping last night.  Best: [phone]  Chart on tracy's desk

## 2014-03-18 NOTE — Telephone Encounter (Signed)
Incoming call from patient. Advised office visit tomorrow with Cassandra Cage, FNP advised by Dr. Sabra Heck. Patient agreeable. Scheduled for tomorrow at 1245 with Cassandra Terrell, [place]. Advised patient may need to plan for ultrasound on Thursday morning, so should plan with her work may need an appointment for ultrasound on Thursday. Patient agreeable. Patient continues to deny complaints at this time.    Patient given instructions for prior to appointment. Advised no intercourse until advised okay by provider. Advised patient to increase rest. Increase fluids, 8 oz of water per hour. Call our office or seek care at closest emergency room if develops any bleeding, pain, fevers or vaginal discharge. Patient verbalized understanding of this.   Patient has not obtained an appointment for [facility] care a this time.   Routing to provider for final review. Patient agreeable to disposition. Will close encounter.      Cc Cassandra Cage, FNP for appointment tomorrow.

## 2014-03-18 NOTE — Telephone Encounter (Addendum)
Spoke with patient. 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 States she had one episode of spotting this morning. Patient states she had a bowel movement and noted spotting on tissue after wiping at approximately 0745 this morning.  Patient reports she is sure that blood came from vaginal area. Had one episode of lower abdominal cramping last night but that has resolved. Sexually active, intercourse last night. States that cramping occurred prior to intercourse. Denies pain or bleeding at this time.  Advised patient would review her symptoms with Dr. Sabra Heck and return call.   Advised patient to rest, drink fluids and call office with any change in symptoms.    Advised if vaginal bleeding develops, pain worsens, fever occurs or any increase or new symptoms to call back immediately. Patient verbalized understanding.    LMP: LMP 01/07/14. Ultrasound for viability completed 03/06/14 with Dr. Sabra Heck

## 2014-03-18 NOTE — Telephone Encounter (Addendum)
Spoke with Dr. Sabra Heck.  Office visit with Cassandra Cage, FNP tomorrow for evaluation.  UItrasound on Thursday 03/20/14 if indicated.  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 She states she will call back when she can.

## 2014-03-19 ENCOUNTER — Encounter: Payer: Self-pay | Admitting: Nurse Practitioner

## 2014-03-19 ENCOUNTER — Ambulatory Visit (INDEPENDENT_AMBULATORY_CARE_PROVIDER_SITE_OTHER): Payer: BC Managed Care – PPO | Admitting: Nurse Practitioner

## 2014-03-19 VITALS — BP 110/66 | HR 68 | Ht 64.0 in | Wt 150.0 lb

## 2014-03-19 DIAGNOSIS — N912 Amenorrhea, unspecified: Secondary | ICD-10-CM

## 2014-03-19 NOTE — Progress Notes (Signed)
Subjective:     Patient ID: Cassandra Terrell, female   DOB: [birth year], 34 y.o.   MRN: [id number]  HPI 34 y.o. G3TAB2 Divorced AA female here for vaginal spotting LMP 01/07/14. EDC based on this is 10/14/14. Yesterday after straining with a bowel movement she noted some pink tinged vaginal spotting with wiping.  She had no pain and no cramps.  After that incident has had no further bleeding.  Feels well. Does report fatigue. Denies nausea.  Review of Systems  Constitutional: Positive for fatigue. Negative for fever, chills and diaphoresis.  Respiratory: Negative.   Cardiovascular: Negative.   Gastrointestinal: Negative.   Genitourinary: Positive for vaginal bleeding. Negative for dysuria, urgency, frequency, hematuria, flank pain and pelvic pain.  Musculoskeletal: Negative.   Skin: Negative.   Neurological: Negative.   Psychiatric/Behavioral: Negative.        Objective:   Physical Exam  Constitutional: She is oriented to person, place, and time. She appears well-developed and well-nourished.  Abdominal: Soft. She exhibits no distension. There is no tenderness. There is no rebound and no guarding.  Genitourinary:  Normal vaginal discharge and no bleeding noted on swab.  Cervical os is closed.  Neurological: She is alert and oriented to person, place, and time.  Psychiatric: She has a normal mood and affect. Her behavior is normal. Judgment and thought content normal.       Assessment:     Early pregnancy @ 10 weeks with vaginal spotting    Plan:     Will continue with plan to repeat PUS in am and make sure everything is OK with pregnancy since she is concerned. Will get serum HCG and follow

## 2014-03-19 NOTE — Progress Notes (Signed)
Ultrasound scheduled for 03/20/14 at 0900 with Dr. Sabra Heck. Patient agreeable.

## 2014-03-19 NOTE — Progress Notes (Signed)
Reviewed personally.  M. Suzanne Trayton Szabo, MD.  

## 2014-03-19 NOTE — Patient Instructions (Signed)
Pelvic ultrasound scheduled for 9 am on Thursday - arrive at 8:45 am

## 2014-03-20 ENCOUNTER — Other Ambulatory Visit: Payer: Self-pay | Admitting: Obstetrics & Gynecology

## 2014-03-20 ENCOUNTER — Ambulatory Visit (INDEPENDENT_AMBULATORY_CARE_PROVIDER_SITE_OTHER): Payer: BC Managed Care – PPO

## 2014-03-20 ENCOUNTER — Ambulatory Visit (INDEPENDENT_AMBULATORY_CARE_PROVIDER_SITE_OTHER): Payer: BC Managed Care – PPO | Admitting: Obstetrics & Gynecology

## 2014-03-20 VITALS — BP 122/82 | Ht 64.0 in | Wt 153.0 lb

## 2014-03-20 DIAGNOSIS — N912 Amenorrhea, unspecified: Secondary | ICD-10-CM

## 2014-03-20 DIAGNOSIS — D251 Intramural leiomyoma of uterus: Secondary | ICD-10-CM

## 2014-03-20 DIAGNOSIS — O200 Threatened abortion: Secondary | ICD-10-CM

## 2014-03-20 LAB — US OB TRANSVAGINAL

## 2014-03-20 LAB — HCG, QUANTITATIVE, PREGNANCY: HCG, BETA CHAIN, QUANT, S: 187594.7 m[IU]/mL

## 2014-03-20 NOTE — Progress Notes (Signed)
34 y.o. G28P2 Divorced AA female here for a pelvic ultrasound due to first trimester bleeding.  Pt reports this has stopped.  She is having no pain or cramping.  Just worried as this is a very desired pregnancy.  Has not transferred care at this time.  Pt does not know blood type.    LMP 01/07/14.  EDC based on this is 10/14/14.  Patient's last menstrual period was 01/07/2014 (exact date).  FINDINGS: UTERUS: singleton IUD with CRL 2.94cm.  EGA on ultrasound is 9 5/7 week.  C/W dating by LMP.  +FCA at 175 BMP. Large posterior 7.2 x 7.0cm fibroids and second 5.2 x 2.8cm poster fibroid.  thirs 3.3 x 3.0cm fibroid noted ADNEXA:   Left ovary with 1.8cm corpus luteal cyst   Right ovary normal CUL DE SAC: no free fluid  Reviewed with pt findings.  No evidence of subchorionic hemorrhage or other source of vaginal bleeding.  She is aware of the fibroids.  Knows it is time to transfer care.  Reviewed choices with her again.  Will check MBT today so will plan to give Rhogam if Rh negative.  Assessment:  First trimester bleeding with singleton IUD Uterine fibroid Plan: ABO Rh today Pt is transferring care to [facility].  Hasn't gotten a call back from them yet but only called for appt yesterday.  ~15 minutes spent with patient >50% of time was in face to face discussion of above.

## 2014-03-21 ENCOUNTER — Telehealth: Payer: Self-pay

## 2014-03-21 LAB — ABO AND RH: RH TYPE: POSITIVE

## 2014-03-21 NOTE — Telephone Encounter (Signed)
-----   Message from Lyman Speller, MD sent at 03/21/2014  6:17 AM EST ----- Inform pt blood type is O+.  She does not need to receive Rhogam.

## 2014-03-21 NOTE — Telephone Encounter (Signed)
Lmtcb//kn 

## 2014-03-30 ENCOUNTER — Encounter: Payer: Self-pay | Admitting: Obstetrics & Gynecology

## 2014-04-11 LAB — OB RESULTS CONSOLE RPR: RPR: NONREACTIVE

## 2014-04-11 LAB — OB RESULTS CONSOLE HEPATITIS B SURFACE ANTIGEN: Hepatitis B Surface Ag: NEGATIVE

## 2014-04-11 LAB — OB RESULTS CONSOLE ANTIBODY SCREEN: Antibody Screen: NEGATIVE

## 2014-04-11 LAB — OB RESULTS CONSOLE RUBELLA ANTIBODY, IGM: RUBELLA: IMMUNE

## 2014-04-11 LAB — OB RESULTS CONSOLE ABO/RH: RH TYPE: POSITIVE

## 2014-04-11 LAB — OB RESULTS CONSOLE HIV ANTIBODY (ROUTINE TESTING): HIV: NONREACTIVE

## 2014-04-28 ENCOUNTER — Ambulatory Visit: Payer: BC Managed Care – PPO | Admitting: Certified Nurse Midwife

## 2014-06-04 NOTE — Telephone Encounter (Signed)
Left detailed message, ok per DPR, with information.//kn 

## 2014-07-23 ENCOUNTER — Other Ambulatory Visit: Payer: Self-pay | Admitting: Obstetrics and Gynecology

## 2014-07-23 NOTE — H&P (Signed)
Cassandra Terrell is a 34 y.o. female presenting for FDIU at 56 week. History of nl NIPT and nl anatomy scan. History of fibroids. Diagnosed FDIU on fu scan.  Maternal Medical History:  Contractions: Onset was less than 1 hour ago.   Perceived severity is mild.    Fetal activity: Perceived fetal activity is none.   Last perceived fetal movement was greater than 24 hours ago.    Prenatal complications: no prenatal complications Prenatal Complications - Diabetes: none.    OB History    Gravida Para Term Preterm AB TAB SAB Ectopic Multiple Living   3    2 2     0     Past Medical History  Diagnosis Date  . Abnormal Pap smear of cervix 04-20-12    ASCUS +HPV HR   Past Surgical History  Procedure Laterality Date  . Colposcopy  05-15-13    ecc showed chronic inflammation   Family History: family history includes Aneurysm in her father; Breast cancer (age of onset: 89) in her mother; Diabetes in her mother and paternal grandmother; Hypertension in her father; Stroke in her paternal grandmother. Social History:  reports that she has never smoked. She has never used smokeless tobacco. She reports that she does not drink alcohol or use illicit drugs.   Prenatal Transfer Tool  Maternal Diabetes: No Genetic Screening: Normal Maternal Ultrasounds/Referrals: Abnormal:  Findings:   Other: Fetal Ultrasounds or other Referrals:  None Maternal Substance Abuse:  No Significant Maternal Medications:  None Significant Maternal Lab Results:  None Other Comments:  None  Review of Systems  Constitutional: Negative.   All other systems reviewed and are negative.     Last menstrual period 01/07/2014. Maternal Exam:  Uterine Assessment: Contraction strength is mild.  Contraction frequency is irregular.   Abdomen: Patient reports no abdominal tenderness. Fetal presentation: vertex  Introitus: Normal vulva. Normal vagina.  Ferning test: not done.  Nitrazine test: not done. Amniotic fluid  character: not assessed.  Pelvis: adequate for delivery.   Cervix: Cervix evaluated by digital exam.     Physical Exam  Constitutional: She is oriented to person, place, and time. She appears well-developed and well-nourished.  HENT:  Head: Normocephalic and atraumatic.  Neck: Normal range of motion. Neck supple.  Cardiovascular: Normal rate and regular rhythm.   Respiratory: Effort normal and breath sounds normal.  GI: Soft. Bowel sounds are normal.  Genitourinary: Vagina normal and uterus normal.  Musculoskeletal: Normal range of motion.  Neurological: She is alert and oriented to person, place, and time. She has normal reflexes.  Skin: Skin is warm and dry.  Psychiatric: She has a normal mood and affect. Her behavior is normal. Judgment and thought content normal.    Prenatal labs: ABO, Rh: O/POS/-- (02/11 1023) Antibody:   Rubella:   RPR:    HBsAg:    HIV:    GBS:     Assessment/Plan: FDIU at 28 weeks Unexplained DIC panel IOL   Cassandra Terrell J 07/23/2014, 9:40 PM

## 2014-07-24 ENCOUNTER — Inpatient Hospital Stay (HOSPITAL_COMMUNITY)
Admission: AD | Admit: 2014-07-24 | Discharge: 2014-07-25 | DRG: 775 | Disposition: A | Discharge status: Home / Self Care | Payer: BC Managed Care – PPO | Source: Ambulatory Visit | Attending: Obstetrics and Gynecology | Admitting: Obstetrics and Gynecology

## 2014-07-24 ENCOUNTER — Inpatient Hospital Stay (HOSPITAL_COMMUNITY): Payer: BC Managed Care – PPO | Admitting: Anesthesiology

## 2014-07-24 ENCOUNTER — Encounter (HOSPITAL_COMMUNITY): Payer: Self-pay | Admitting: *Deleted

## 2014-07-24 DIAGNOSIS — Z823 Family history of stroke: Secondary | ICD-10-CM | POA: Diagnosis not present

## 2014-07-24 DIAGNOSIS — Z3A27 27 weeks gestation of pregnancy: Secondary | ICD-10-CM | POA: Diagnosis present

## 2014-07-24 DIAGNOSIS — Z8249 Family history of ischemic heart disease and other diseases of the circulatory system: Secondary | ICD-10-CM

## 2014-07-24 DIAGNOSIS — Z803 Family history of malignant neoplasm of breast: Secondary | ICD-10-CM | POA: Diagnosis not present

## 2014-07-24 DIAGNOSIS — O364XX0 Maternal care for intrauterine death, not applicable or unspecified: Principal | ICD-10-CM | POA: Diagnosis present

## 2014-07-24 DIAGNOSIS — IMO0002 Reserved for concepts with insufficient information to code with codable children: Secondary | ICD-10-CM | POA: Diagnosis present

## 2014-07-24 DIAGNOSIS — Z833 Family history of diabetes mellitus: Secondary | ICD-10-CM | POA: Diagnosis not present

## 2014-07-24 LAB — PROTIME-INR
INR: 1.03 (ref 0.00–1.49)
INR: 1.03 (ref 0.00–1.49)
Prothrombin Time: 13.7 seconds (ref 11.6–15.2)
Prothrombin Time: 13.7 seconds (ref 11.6–15.2)

## 2014-07-24 LAB — CBC
HCT: 30.4 % — ABNORMAL LOW (ref 36.0–46.0)
HCT: 31.6 % — ABNORMAL LOW (ref 36.0–46.0)
HEMOGLOBIN: 10.9 g/dL — AB (ref 12.0–15.0)
HEMOGLOBIN: 11.5 g/dL — AB (ref 12.0–15.0)
MCH: 27.9 pg (ref 26.0–34.0)
MCH: 28.4 pg (ref 26.0–34.0)
MCHC: 35.9 g/dL (ref 30.0–36.0)
MCHC: 36.4 g/dL — AB (ref 30.0–36.0)
MCV: 77.7 fL — AB (ref 78.0–100.0)
MCV: 78 fL (ref 78.0–100.0)
Platelets: 269 10*3/uL (ref 150–400)
Platelets: 262 10*3/uL (ref 150–400)
RBC: 3.91 MIL/uL (ref 3.87–5.11)
RBC: 4.05 MIL/uL (ref 3.87–5.11)
RDW: 13.7 % (ref 11.5–15.5)
RDW: 13.6 % (ref 11.5–15.5)
WBC: 8.2 10*3/uL (ref 4.0–10.5)
WBC: 14 10*3/uL — ABNORMAL HIGH (ref 4.0–10.5)

## 2014-07-24 LAB — APTT
APTT: 27 s (ref 24–37)
aPTT: 37 seconds (ref 24–37)

## 2014-07-24 LAB — TYPE AND SCREEN
ABO/RH(D): O POS
ANTIBODY SCREEN: NEGATIVE

## 2014-07-24 LAB — D-DIMER, QUANTITATIVE: D-Dimer, Quant: 1.22 ug/mL-FEU — ABNORMAL HIGH (ref 0.00–0.48)

## 2014-07-24 LAB — ABO/RH: ABO/RH(D): O POS

## 2014-07-24 LAB — RPR: RPR Ser Ql: NONREACTIVE

## 2014-07-24 LAB — FIBRINOGEN: FIBRINOGEN: 398 mg/dL (ref 204–475)

## 2014-07-24 MED ORDER — BUTORPHANOL TARTRATE 1 MG/ML IJ SOLN
1.0000 mg | INTRAMUSCULAR | Status: DC | PRN
  Administered 2014-07-24 (×2): 1 mg via INTRAVENOUS
  Filled 2014-07-24 (×2): qty 1

## 2014-07-24 MED ORDER — PHENYLEPHRINE 40 MCG/ML (10ML) SYRINGE FOR IV PUSH (FOR BLOOD PRESSURE SUPPORT)
80.0000 ug | PREFILLED_SYRINGE | INTRAVENOUS | Status: DC | PRN
  Administered 2014-07-24: 80 ug via INTRAVENOUS
  Filled 2014-07-24: qty 20

## 2014-07-24 MED ORDER — OXYTOCIN 40 UNITS IN LACTATED RINGERS INFUSION - SIMPLE MED
62.5000 mL/h | INTRAVENOUS | Status: DC
  Administered 2014-07-25: 62.5 mL/h via INTRAVENOUS
  Administered 2014-07-25: 999 mL/h via INTRAVENOUS
  Filled 2014-07-24: qty 1000

## 2014-07-24 MED ORDER — LIDOCAINE HCL (PF) 1 % IJ SOLN
INTRAMUSCULAR | Status: DC | PRN
  Administered 2014-07-24 (×2): 4 mL

## 2014-07-24 MED ORDER — FENTANYL 2.5 MCG/ML BUPIVACAINE 1/10 % EPIDURAL INFUSION (WH - ANES)
14.0000 mL/h | INTRAMUSCULAR | Status: DC | PRN
  Administered 2014-07-24 (×2): 14 mL/h via EPIDURAL
  Filled 2014-07-24: qty 125

## 2014-07-24 MED ORDER — LIDOCAINE HCL (PF) 1 % IJ SOLN
30.0000 mL | INTRAMUSCULAR | Status: DC | PRN
  Filled 2014-07-24: qty 30

## 2014-07-24 MED ORDER — ZOLPIDEM TARTRATE 5 MG PO TABS: 5.0000 mg | ORAL_TABLET | Freq: Every evening | ORAL | Status: DC | PRN

## 2014-07-24 MED ORDER — TERBUTALINE SULFATE 1 MG/ML IJ SOLN: 0.2500 mg | Freq: Once | INTRAMUSCULAR | Status: AC | PRN

## 2014-07-24 MED ORDER — LACTATED RINGERS IV SOLN
INTRAVENOUS | Status: DC
  Administered 2014-07-24 – 2014-07-25 (×3): via INTRAVENOUS

## 2014-07-24 MED ORDER — DIPHENHYDRAMINE HCL 50 MG/ML IJ SOLN: 12.5000 mg | INTRAMUSCULAR | Status: DC | PRN

## 2014-07-24 MED ORDER — ONDANSETRON HCL 4 MG/2ML IJ SOLN: 4.0000 mg | Freq: Four times a day (QID) | INTRAMUSCULAR | Status: DC | PRN

## 2014-07-24 MED ORDER — OXYCODONE-ACETAMINOPHEN 5-325 MG PO TABS: 2.0000 | ORAL_TABLET | ORAL | Status: DC | PRN

## 2014-07-24 MED ORDER — CITRIC ACID-SODIUM CITRATE 334-500 MG/5ML PO SOLN: 30.0000 mL | ORAL | Status: DC | PRN

## 2014-07-24 MED ORDER — EPHEDRINE 5 MG/ML INJ: 10.0000 mg | INTRAVENOUS | Status: DC | PRN

## 2014-07-24 MED ORDER — OXYTOCIN BOLUS FROM INFUSION: 500.0000 mL | INTRAVENOUS | Status: DC

## 2014-07-24 MED ORDER — OXYCODONE-ACETAMINOPHEN 5-325 MG PO TABS: 1.0000 | ORAL_TABLET | ORAL | Status: DC | PRN

## 2014-07-24 MED ORDER — MISOPROSTOL 200 MCG PO TABS
400.0000 ug | ORAL_TABLET | ORAL | Status: DC
  Administered 2014-07-24 (×6): 400 ug via VAGINAL
  Filled 2014-07-24 (×6): qty 2

## 2014-07-24 MED ORDER — FLEET ENEMA 7-19 GM/118ML RE ENEM: 1.0000 | ENEMA | RECTAL | Status: DC | PRN

## 2014-07-24 MED ORDER — ACETAMINOPHEN 325 MG PO TABS
650.0000 mg | ORAL_TABLET | ORAL | Status: DC | PRN
  Administered 2014-07-24 (×2): 650 mg via ORAL
  Filled 2014-07-24 (×2): qty 2

## 2014-07-24 MED ORDER — LACTATED RINGERS IV SOLN
500.0000 mL | INTRAVENOUS | Status: DC | PRN
  Administered 2014-07-24: 500 mL via INTRAVENOUS

## 2014-07-24 NOTE — Progress Notes (Signed)
Grief support for patient, FOB, and patient's mom. Educated patient and family about chaplain services and support. Also brought Heart[employer] information. Encouraged them to utilize grief resources available.  Epifania Gore, PhD, [place]

## 2014-07-24 NOTE — Progress Notes (Signed)
Cassandra Terrell is a 34 y.o. G3P0020 at [redacted]w[redacted]d by LMP admitted for induction of labor due to fetal demise.  Subjective: Feels crampy Received Stadol  Objective: BP 111/59 mmHg  Pulse 89  Temp(Src) 99 F (37.2 C) (Oral)  Resp 18  Ht 5\' 4"  (1.626 m)  Wt 70.478 kg (155 lb 6 oz)  BMI 26.66 kg/m2  LMP 01/07/2014 (Exact Date)      FHT:  FHR: na bpm, variability: absent,  accelerations:  Abscent,  decelerations:  Absent UC:   irregular, every 4-6 minutes SVE:   Dilation: Closed Effacement (%): 70 Station: -3 Exam by:: Dr. Ronita Terrell cytotec placed in posterior fornix Labs: Lab Results  Component Value Date   WBC 8.2 07/24/2014   HGB 10.9* 07/24/2014   HCT 30.4* 07/24/2014   MCV 77.7* 07/24/2014   PLT 269 07/24/2014    Assessment / Plan: 27 fetal demise  Labor: Progressing normally Preeclampsia:  no signs or symptoms of toxicity Fetal Wellbeing:  na Pain Control:  Labor support without medications I/D:  n/a Anticipated MOD:  NSVD  Cassandra Terrell J 07/24/2014, 1:55 PM

## 2014-07-24 NOTE — Anesthesia Preprocedure Evaluation (Addendum)
Anesthesia Evaluation  Patient identified by MRN, date of birth, ID band Patient awake    Reviewed: Allergy & Precautions, NPO status , Patient's Chart, lab work & pertinent test results  History of Anesthesia Complications Negative for: history of anesthetic complications  Airway Mallampati: II  TM Distance: >3 FB Neck ROM: Full    Dental no notable dental hx. (+) Dental Advisory Given   Pulmonary neg pulmonary ROS,  breath sounds clear to auscultation  Pulmonary exam normal       Cardiovascular negative cardio ROS Normal cardiovascular examRhythm:Regular Rate:Normal     Neuro/Psych negative neurological ROS  negative psych ROS   GI/Hepatic negative GI ROS, Neg liver ROS,   Endo/Other  negative endocrine ROS  Renal/GU negative Renal ROS  negative genitourinary   Musculoskeletal negative musculoskeletal ROS (+)   Abdominal   Peds negative pediatric ROS (+)  Hematology negative hematology ROS (+)   Anesthesia Other Findings   Reproductive/Obstetrics (+) Pregnancy IUFD                             Anesthesia Physical Anesthesia Plan  ASA: II  Anesthesia Plan: Epidural   Post-op Pain Management:    Induction:   Airway Management Planned:   Additional Equipment:   Intra-op Plan:   Post-operative Plan:   Informed Consent: I have reviewed the patients History and Physical, chart, labs and discussed the procedure including the risks, benefits and alternatives for the proposed anesthesia with the patient or authorized representative who has indicated his/her understanding and acceptance.   Dental advisory given  Plan Discussed with: CRNA  Anesthesia Plan Comments:         Anesthesia Quick Evaluation

## 2014-07-24 NOTE — Progress Notes (Signed)
Discussed with pt and family about perinatal loss care offered by the hospital.

## 2014-07-24 NOTE — Anesthesia Procedure Notes (Signed)
Epidural Patient location during procedure: OB  Staffing Anesthesiologist: Ayerim Berquist Performed by: anesthesiologist   Preanesthetic Checklist Completed: patient identified, site marked, surgical consent, pre-op evaluation, timeout performed, IV checked, risks and benefits discussed and monitors and equipment checked  Epidural Patient position: sitting Prep: site prepped and draped and DuraPrep Patient monitoring: continuous pulse ox and blood pressure Approach: midline Location: L3-L4 Injection technique: LOR saline  Needle:  Needle type: Tuohy  Needle gauge: 17 G Needle length: 9 cm and 9 Needle insertion depth: 6 cm Catheter type: closed end flexible Catheter size: 19 Gauge Catheter at skin depth: 10 cm Test dose: negative  Assessment Events: blood not aspirated, injection not painful, no injection resistance, negative IV test and no paresthesia  Additional Notes Patient identified. Risks/Benefits/Options discussed with patient including but not limited to bleeding, infection, nerve damage, paralysis, failed block, incomplete pain control, headache, blood pressure changes, nausea, vomiting, reactions to medication both or allergic, itching and postpartum back pain. Confirmed with bedside nurse the patient's most recent platelet count. Confirmed with patient that they are not currently taking any anticoagulation, have any bleeding history or any family history of bleeding disorders. Patient expressed understanding and wished to proceed. All questions were answered. Sterile technique was used throughout the entire procedure. Please see nursing notes for vital signs. Test dose was given through epidural catheter and negative prior to continuing to dose epidural or start infusion. Warning signs of high block given to the patient including shortness of breath, tingling/numbness in hands, complete motor block, or any concerning symptoms with instructions to call for help. Patient was  given instructions on fall risk and not to get out of bed. All questions and concerns addressed with instructions to call with any issues or inadequate analgesia.

## 2014-07-24 NOTE — Plan of Care (Signed)
Problem: Consults Goal: Birthing Suites Patient Information Press F2 to bring up selections list  Outcome: Completed/Met Date Met:  07/24/14  IUFD (Intrauterine fetal demise)

## 2014-07-25 ENCOUNTER — Encounter (HOSPITAL_COMMUNITY): Payer: Self-pay | Admitting: Emergency Medicine

## 2014-07-25 MED ORDER — HYDROCODONE-IBUPROFEN 7.5-200 MG PO TABS: 1.0000 | ORAL_TABLET | Freq: Three times a day (TID) | ORAL | Status: DC | PRN

## 2014-08-21 ENCOUNTER — Telehealth: Payer: Self-pay | Admitting: *Deleted

## 2014-08-21 NOTE — Telephone Encounter (Signed)
Out of recall.  Thanks.

## 2014-08-21 NOTE — Telephone Encounter (Signed)
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 Please advise recall.

## 2014-08-26 NOTE — Discharge Summary (Signed)
[name], Cassandra Terrell               ACCOUNT NO.:  [id number]  MEDICAL RECORD NO.:  [id number]  LOCATION:  4008                          FACILITY:  [facility]  PHYSICIAN:  Lovenia Kim, M.D.DATE OF BIRTH:  [birth year]  DATE OF ADMISSION:  07/24/2014 DATE OF DISCHARGE:  07/25/2014                              DISCHARGE SUMMARY   PREOPERATIVE DIAGNOSIS:  Fetal demise at 27 weeks, unexplained.  DISCHARGE DIAGNOSIS:  Fetal demise at 27 weeks, unexplained.  HOSPITAL COURSE:  The patient was admitted for induction of labor due to unexplained fetal demise at 28 weeks.  She was given Cytotec multiple dosing in order to do a spontaneous labor.  She had an epidural and delivered a nonviable female fetus with Apgars 0 and 0.  No complications.  ESTIMATED BLOOD LOSS POST DELIVERY:  200 mL.  DISCHARGE DISPOSITION:  The patient was discharged home the same day. Discharge teaching done.  Appropriate grieving counseling done.  DISCHARGE MEDICATIONS:  Prenatal vitamins and Motrin.  The patient to follow up in the office in 1 week.  The patient declines biopsy.  Baby is to the morgue after appropriate grieving was done and counseling given.     Lovenia Kim, M.D.     RJT/MEDQ  D:  08/26/2014  T:  08/26/2014  Job:  [id number]

## 2014-08-28 NOTE — Telephone Encounter (Signed)
Pt removed from current recall.  Closing encounter.

## 2015-05-19 ENCOUNTER — Encounter: Payer: Self-pay | Admitting: Certified Nurse Midwife

## 2015-05-19 ENCOUNTER — Ambulatory Visit (INDEPENDENT_AMBULATORY_CARE_PROVIDER_SITE_OTHER): Payer: BC Managed Care – PPO | Admitting: Certified Nurse Midwife

## 2015-05-19 VITALS — BP 110/70 | HR 70 | Resp 16 | Ht 64.0 in | Wt 135.0 lb

## 2015-05-19 DIAGNOSIS — Z124 Encounter for screening for malignant neoplasm of cervix: Secondary | ICD-10-CM | POA: Diagnosis not present

## 2015-05-19 DIAGNOSIS — Z01419 Encounter for gynecological examination (general) (routine) without abnormal findings: Secondary | ICD-10-CM

## 2015-05-19 DIAGNOSIS — Z862 Personal history of diseases of the blood and blood-forming organs and certain disorders involving the immune mechanism: Secondary | ICD-10-CM

## 2015-05-19 DIAGNOSIS — Z3049 Encounter for surveillance of other contraceptives: Secondary | ICD-10-CM

## 2015-05-19 DIAGNOSIS — Z0000 Encounter for general adult medical examination without abnormal findings: Secondary | ICD-10-CM | POA: Diagnosis not present

## 2015-05-19 LAB — CBC
HCT: 38.6 % (ref 35.0–45.0)
Hemoglobin: 12.6 g/dL (ref 11.7–15.5)
MCH: 26.3 pg — ABNORMAL LOW (ref 27.0–33.0)
MCHC: 32.6 g/dL (ref 32.0–36.0)
MCV: 80.6 fL (ref 80.0–100.0)
MPV: 10.4 fL (ref 7.5–12.5)
PLATELETS: 319 10*3/uL (ref 140–400)
RBC: 4.79 MIL/uL (ref 3.80–5.10)
RDW: 14.1 % (ref 11.0–15.0)
WBC: 4.4 10*3/uL (ref 3.8–10.8)

## 2015-05-19 LAB — IRON: Iron: 144 ug/dL (ref 40–190)

## 2015-05-19 LAB — TSH: TSH: 0.9 mIU/L

## 2015-05-19 LAB — IBC PANEL
%SAT: 34 % (ref 11–50)
TIBC: 424 ug/dL (ref 250–450)
UIBC: 280 ug/dL (ref 125–400)

## 2015-05-19 LAB — POCT URINALYSIS DIPSTICK
BILIRUBIN UA: NEGATIVE
Blood, UA: NEGATIVE
GLUCOSE UA: NEGATIVE
KETONES UA: NEGATIVE
Leukocytes, UA: NEGATIVE
Nitrite, UA: NEGATIVE
Protein, UA: NEGATIVE
Urobilinogen, UA: NEGATIVE
pH, UA: 5

## 2015-05-19 MED ORDER — NUVARING 0.12-0.015 MG/24HR VA RING: VAGINAL_RING | VAGINAL | Status: DC

## 2015-05-19 NOTE — Progress Notes (Signed)
35 y.o. [id number] Divorced  African American Fe here for annual exam. Periods normal, no issues. [place] working well for contraception. Still grieving her pregnancy loss from 6/16, but coping better now. Aware of grief counseling and group therapy with Hospice, she has done the memory program. Same partner no changes and he is very supportive and well as family. Sees PCP prn. Has occasional spontaneous urine leakage, only drops. Denies urinary frequency or urgency or pain. No other health issues today. Desires screening labs.  Patient's last menstrual period was 05/13/2015.          Sexually active: Yes.    The current method of family planning is NuvaRing vaginal inserts.    Exercising: Yes.    walking Smoker:  no  Health Maintenance: Pap:  04-23-13 ASCUS HPV HR +, colpo 3/15 LGSIL MMG:  None, but mother hx of breast cancer Colonoscopy:  none BMD:   none TDaP:  2013 Shingles: none Pneumonia: none Hep C and HIV: not done Labs: none Self breast exam: done monthly   reports that she has never smoked. She has never used smokeless tobacco. She reports that she does not drink alcohol or use illicit drugs.  Past Medical History  Diagnosis Date  . Abnormal Pap smear of cervix 04-20-12    ASCUS +HPV HR    Past Surgical History  Procedure Laterality Date  . Colposcopy  05-15-13    ecc showed chronic inflammation    Current Outpatient Prescriptions  Medication Sig Dispense Refill  . NUVARING 0.12-0.015 MG/24HR vaginal ring Reported on 05/19/2015     No current facility-administered medications for this visit.    Family History  Problem Relation Age of Onset  . Breast cancer Mother 5  . Diabetes Mother   . Hypertension Father   . Aneurysm Father   . Diabetes Paternal Grandmother   . Stroke Paternal Grandmother     ROS:  Pertinent items are noted in HPI.  Otherwise, a comprehensive ROS was negative.  Exam:   BP 110/70 mmHg  Pulse 70  Resp 16  Ht 5\' 4"  (1.626 m)  Wt 135 lb  (61.236 kg)  BMI 23.16 kg/m2  LMP 05/13/2015 Height: 5\' 4"  (162.6 cm) Ht Readings from Last 3 Encounters:  05/19/15 5\' 4"  (1.626 m)  07/24/14 5\' 4"  (1.626 m)  03/20/14 5\' 4"  (1.626 m)    General appearance: alert, cooperative and appears stated age Head: Normocephalic, without obvious abnormality, atraumatic Neck: no adenopathy, supple, symmetrical, trachea midline and thyroid normal to inspection and palpation Lungs: clear to auscultation bilaterally CVAT negative bilateral Breasts: normal appearance, no masses or tenderness, No nipple retraction or dimpling, No nipple discharge or bleeding, No axillary or supraclavicular adenopathy Heart: regular rate and rhythm Abdomen: soft, non-tender; no masses,  no organomegaly, negative suprapubic Extremities: extremities normal, atraumatic, no cyanosis or edema Skin: Skin color, texture, turgor normal. No rashes or lesions, warm and dry Lymph nodes: Cervical, supraclavicular, and axillary nodes normal. No abnormal inguinal nodes palpated Neurologic: Grossly normal   Pelvic: External genitalia:  no lesions, normal female              Urethra:  normal appearing urethra with no masses, tenderness or lesions  Bladder, urethral meatus normal, non tender              Bartholin's and Skene's: normal                 Vagina: normal appearing vagina with normal color and discharge, no  lesions, good support              Cervix: normal,non tender, no lesions              Pap taken: Yes.   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, follow up pap today  Spontaneous delivery of stillborn at 40  weeks, still grieving, but appropriately  Spontaneous urinary leakage, R/O UTI  Screening labs  Family history of breast cancer mother age 80  P:   Reviewed health and wellness pertinent to exam  Rx Nuvaring see order  If negative will repeat one year, if not per results  Encouraged to continue to seek support as needed with family and Hospice  Urine culture  Labs TSH,Vitamin D, IBC, CBC,Iron  Pap smear as above with HPVHR  Discussed mammograms should start at age 69 and she is aware of genetic screening declines any other information.   counseled on breast self exam, mammography screening, STD prevention, HIV risk factors and prevention, adequate intake of calcium and vitamin D, diet and exercise, risks/benefits/ warning signs with Nuvaring use.  return annually or prn  An After Visit Summary was printed and given to the patient.

## 2015-05-19 NOTE — Patient Instructions (Signed)

## 2015-05-20 ENCOUNTER — Other Ambulatory Visit: Payer: Self-pay

## 2015-05-20 DIAGNOSIS — E559 Vitamin D deficiency, unspecified: Secondary | ICD-10-CM

## 2015-05-20 LAB — VITAMIN D 25 HYDROXY (VIT D DEFICIENCY, FRACTURES): Vit D, 25-Hydroxy: 17 ng/mL — ABNORMAL LOW (ref 30–100)

## 2015-05-20 MED ORDER — VITAMIN D (ERGOCALCIFEROL) 1.25 MG (50000 UNIT) PO CAPS: 50000.0000 [IU] | ORAL_CAPSULE | ORAL | Status: DC

## 2015-05-20 NOTE — Progress Notes (Signed)
Encounter reviewed Jill Jertson, MD   

## 2015-05-21 ENCOUNTER — Telehealth: Payer: Self-pay

## 2015-05-21 ENCOUNTER — Other Ambulatory Visit: Payer: Self-pay

## 2015-05-21 DIAGNOSIS — A499 Bacterial infection, unspecified: Secondary | ICD-10-CM

## 2015-05-21 LAB — IPS PAP TEST WITH HPV

## 2015-05-21 MED ORDER — METRONIDAZOLE 0.75 % VA GEL: 1.0000 | Freq: Two times a day (BID) | VAGINAL | Status: DC

## 2015-05-21 NOTE — Telephone Encounter (Signed)
rx sent to pharmacy for metrogel. See pap results.

## 2015-05-21 NOTE — Telephone Encounter (Signed)
Patient notified of results. See lab 

## 2015-05-21 NOTE — Telephone Encounter (Signed)
lmtcb

## 2015-05-21 NOTE — Telephone Encounter (Signed)
-----   Message from Regina Eck, CNM sent at 05/21/2015 10:06 AM EDT ----- Notify patient pap smear normal, Negative HPVHR 02 BV noted, if symptomatic will need Rx Metrogel for 5 days Endometrial cells noted with LMP profile

## 2015-08-19 ENCOUNTER — Other Ambulatory Visit: Payer: BC Managed Care – PPO

## 2015-08-19 DIAGNOSIS — E559 Vitamin D deficiency, unspecified: Secondary | ICD-10-CM

## 2015-08-20 LAB — VITAMIN D 25 HYDROXY (VIT D DEFICIENCY, FRACTURES): VIT D 25 HYDROXY: 45 ng/mL (ref 30–100)

## 2015-09-04 ENCOUNTER — Telehealth: Payer: Self-pay | Admitting: Certified Nurse Midwife

## 2015-09-04 ENCOUNTER — Ambulatory Visit: Payer: BC Managed Care – PPO | Admitting: Certified Nurse Midwife

## 2015-09-04 NOTE — Telephone Encounter (Signed)
Patient canceled her appointment today due to cycke started, rs to 09/07/15 with JJ.

## 2015-09-07 ENCOUNTER — Encounter: Payer: Self-pay | Admitting: Obstetrics and Gynecology

## 2015-09-07 ENCOUNTER — Ambulatory Visit (INDEPENDENT_AMBULATORY_CARE_PROVIDER_SITE_OTHER): Payer: BC Managed Care – PPO | Admitting: Obstetrics and Gynecology

## 2015-09-07 VITALS — BP 118/70 | HR 76 | Resp 16 | Ht 64.0 in | Wt 140.0 lb

## 2015-09-07 DIAGNOSIS — N760 Acute vaginitis: Secondary | ICD-10-CM | POA: Diagnosis not present

## 2015-09-07 NOTE — Progress Notes (Signed)
GYNECOLOGY  VISIT   HPI: 35 y.o.   Divorced  [place] American  female   [id number] with Patient's last menstrual period was 09/01/2015.   here for   Vaginal irritation on outside of vagina. Patient states this occurs before starting menstrual cycles. She c/o cyclic vulvar irritation for the last several months. It starts a few days prior to removing her nuvaring every month. No abnormal d/c. She is mainly itchy and irritated. Not burning.  No urinary c/o.  She is sexually active, same partner x 1.5 years.   GYNECOLOGIC HISTORY: Patient's last menstrual period was 09/01/2015. Contraception: NuvaRing Menopausal hormone therapy: n/a        OB History    Gravida Para Term Preterm AB Living   3 1   1 2  0   SAB TAB Ectopic Multiple Live Births     2   0           Patient Active Problem List   Diagnosis Date Noted  . Fetal demise - delivered (6/17) 07/24/2014  . Abnormal Papanicolaou smear of cervix and cervical HPV(795.0) 05/15/2012    Past Medical History:  Diagnosis Date  . Abnormal Pap smear of cervix 04-20-12   ASCUS +HPV HR    Past Surgical History:  Procedure Laterality Date  . COLPOSCOPY  05-15-13   ecc showed chronic inflammation    Current Outpatient Prescriptions  Medication Sig Dispense Refill  . NUVARING 0.12-0.015 MG/24HR vaginal ring Reported on 05/19/2015 1 each 12   No current facility-administered medications for this visit.      ALLERGIES: Review of patient's allergies indicates no known allergies.  Family History  Problem Relation Age of Onset  . Diabetes Paternal Grandmother   . Stroke Paternal Grandmother   . Breast cancer Mother 44  . Diabetes Mother   . Hypertension Father   . Aneurysm Father     Social History   Social History  . Marital status: Divorced    Spouse name: N/A  . Number of children: N/A  . Years of education: N/A   Occupational History  . Not on file.   Social History Main Topics  . Smoking status: Never Smoker  .  Smokeless tobacco: Never Used  . Alcohol use No  . Drug use: No  . Sexual activity: Yes    Partners: Male    Birth control/ protection: Inserts     Comment: nuvaring   Other Topics Concern  . Not on file   Social History Narrative  . No narrative on file    Review of Systems  HENT: Negative.   Eyes: Negative.   Respiratory: Negative.   Cardiovascular: Negative.   Genitourinary: Negative.        Vulvar/vaginal itching No longer moist during sexual intercourse.   Musculoskeletal: Negative.   Skin: Negative.   Neurological: Negative.   Endo/Heme/Allergies: Negative.   Psychiatric/Behavioral: Negative.     PHYSICAL EXAMINATION:    BP 118/70 (BP Location: Right Arm, Patient Position: Sitting, Cuff Size: Normal)   Pulse 76   Resp 16   Ht 5\' 4"  (1.626 m)   Wt 140 lb (63.5 kg)   LMP 09/01/2015   BMI 24.03 kg/m     General appearance: alert, cooperative and appears stated age   Pelvic: External genitalia:  no lesions              Urethra:  normal appearing urethra with no masses, tenderness or lesions  Bartholins and Skenes: normal                 Vagina: normal appearing vagina with normal color and discharge, no lesions              Cervix: no lesions             Chaperone was present for exam.  Wet prep: ? clue, no trich, rare wbc KOH: no yeast PH: 4-4.5   ASSESSMENT Vulvovaginitis, vaginal slides suspicious for BV, symptoms more c/w yeast    PLAN Will send a wet prep probe, treatment depending on results, suspect she will need at least flagyl Discussed vulvar skin care Will use Vaseline externally as needed    An After Visit Summary was printed and given to the patient.

## 2015-09-08 LAB — WET PREP BY MOLECULAR PROBE
CANDIDA SPECIES: POSITIVE — AB
Gardnerella vaginalis: POSITIVE — AB
Trichomonas vaginosis: NEGATIVE

## 2015-09-10 ENCOUNTER — Telehealth: Payer: Self-pay

## 2015-09-10 MED ORDER — METRONIDAZOLE 0.75 % VA GEL: 1.0000 | Freq: Every day | VAGINAL | 0 refills | Status: DC

## 2015-09-10 MED ORDER — FLUCONAZOLE 150 MG PO TABS: 150.0000 mg | ORAL_TABLET | Freq: Once | ORAL | 0 refills | Status: AC

## 2015-09-10 NOTE — Telephone Encounter (Signed)
-----   Message from Salvadore Dom, MD sent at 09/08/2015  1:35 PM EDT ----- Please inform the patient that her vaginitis probe was + for BV and treat with flagyl (either oral or vaginal, her choice), no ETOH while on Flagyl.  Oral: Flagyl 500 mg BID x 7 days, or Vaginal: Metrogel, 1 applicator per vagina q day x 5 days. She is also + for yeast. Treat with diflucan 150 mg po q 72 hours x 2, I would start the diflucan so her second dose is at the end of her treatment.

## 2015-09-10 NOTE — Telephone Encounter (Signed)
Left message to call Kaitlyn at [phone]. 

## 2015-09-10 NOTE — Telephone Encounter (Signed)
Spoke with patient. Advised of message as seen below from [place]. Patient is agreeable and verbalizes understanding. She would like to use Metrogel for treatment of BV. Rx for Metrogel place 1 applicator at bedtime x 5 days 0RF and Diflucan 150 mg take one tablet now and second tablet after completion on Metrogel #2 0RF sent to pharmacy on file. Patient is agreeable.  Routing to provider for final review. Patient agreeable to disposition. Will close encounter.

## 2016-02-04 ENCOUNTER — Other Ambulatory Visit: Payer: Self-pay | Admitting: Obstetrics and Gynecology

## 2016-02-04 DIAGNOSIS — N644 Mastodynia: Secondary | ICD-10-CM

## 2016-02-09 ENCOUNTER — Ambulatory Visit: Payer: BC Managed Care – PPO | Admitting: Obstetrics and Gynecology

## 2016-02-11 ENCOUNTER — Ambulatory Visit
Admission: RE | Admit: 2016-02-11 | Discharge: 2016-02-11 | Disposition: A | Discharge status: Home / Self Care | Payer: BC Managed Care – PPO | Source: Ambulatory Visit | Attending: Obstetrics and Gynecology | Admitting: Obstetrics and Gynecology

## 2016-02-11 ENCOUNTER — Other Ambulatory Visit: Payer: Self-pay | Admitting: Obstetrics and Gynecology

## 2016-02-11 DIAGNOSIS — R2231 Localized swelling, mass and lump, right upper limb: Secondary | ICD-10-CM

## 2016-02-11 DIAGNOSIS — N644 Mastodynia: Secondary | ICD-10-CM

## 2016-05-19 ENCOUNTER — Ambulatory Visit: Payer: BC Managed Care – PPO | Admitting: Certified Nurse Midwife

## 2016-12-17 IMAGING — US US OB TRANSVAGINAL
1 series · 11 of 11 positions shown · non-contrast
Comparison: none

[Series 1: us ob transvaginal · 0.16mm/px · 11 of 11 slices shown]
[im 1/11]
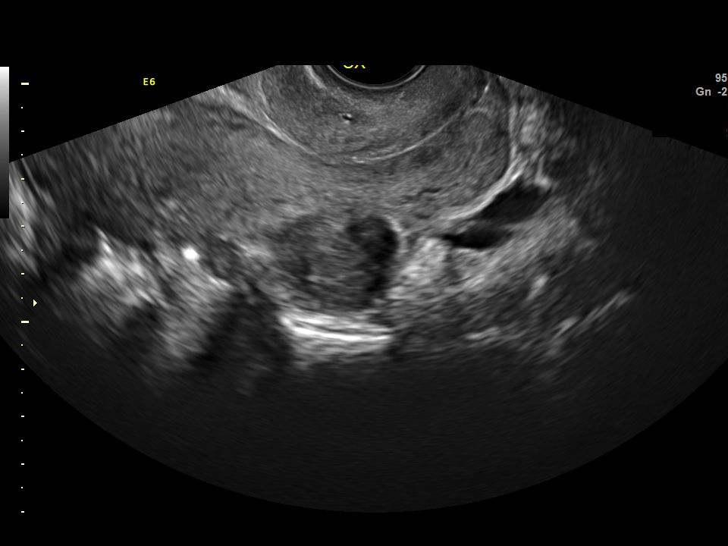
[im 2/11]
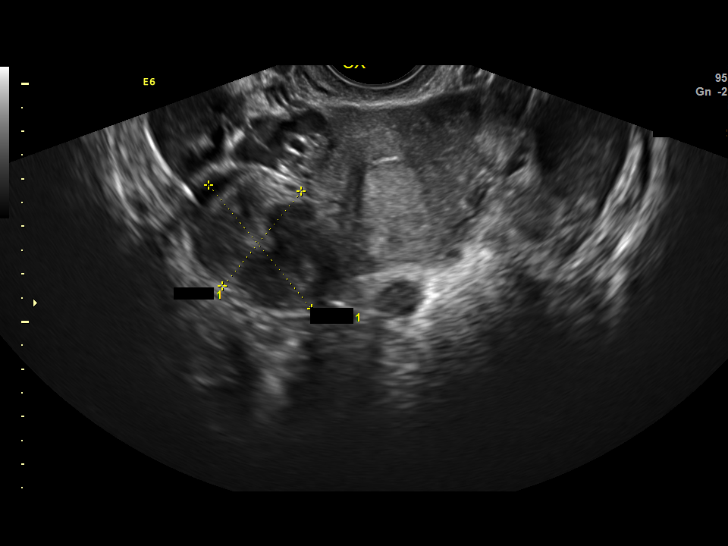
[im 3/11]
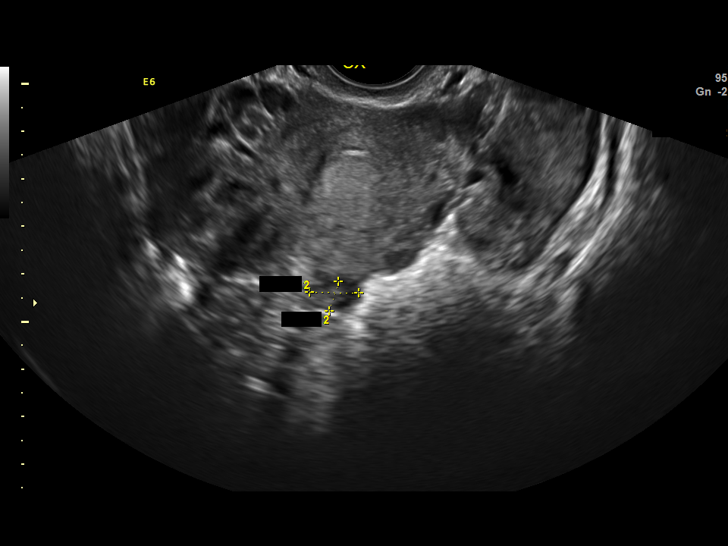
[im 4/11]
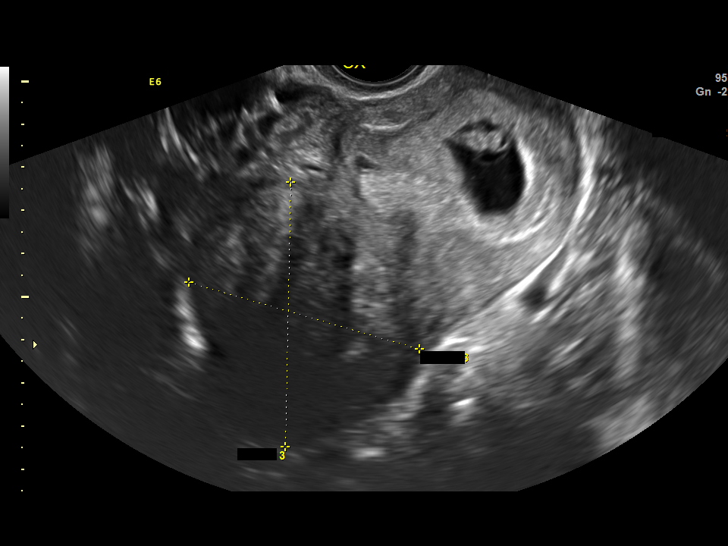
[im 5/11]
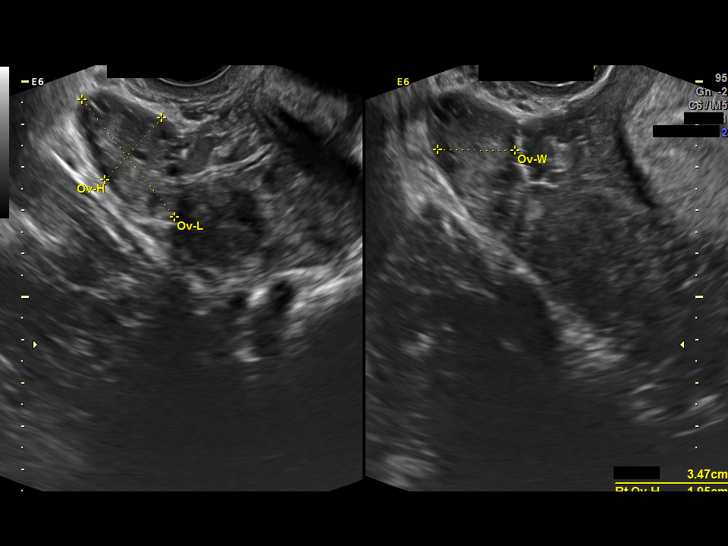
[im 6/11]
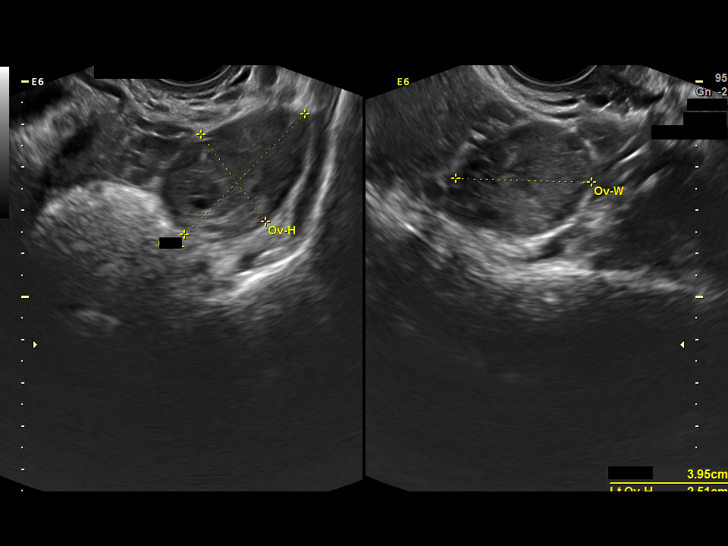
[im 7/11]
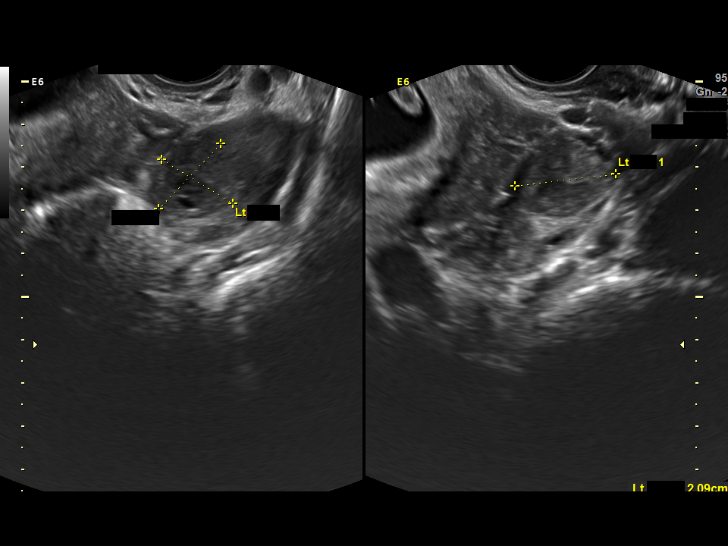
[im 8/11]
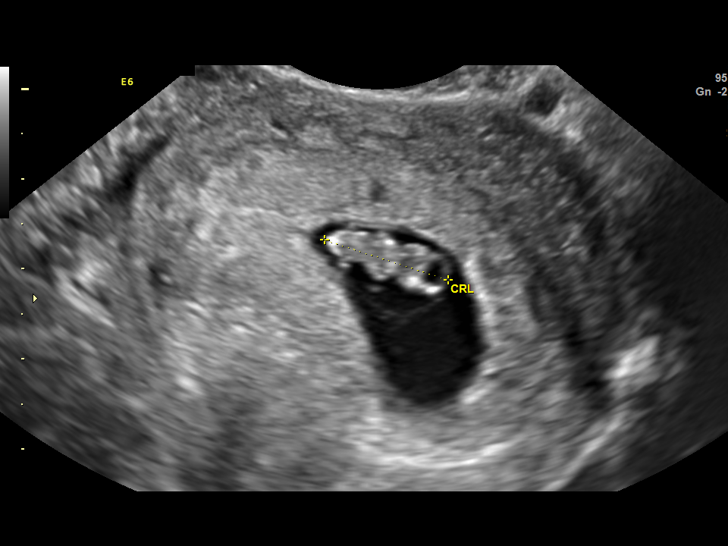
[im 9/11]
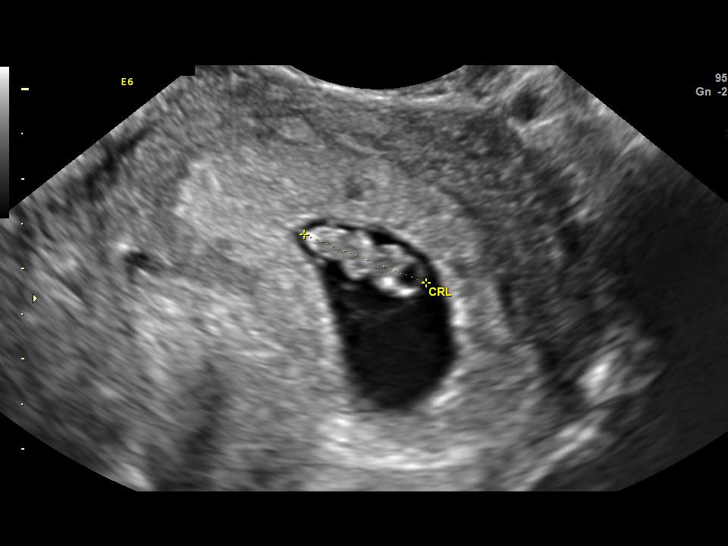
[im 10/11]
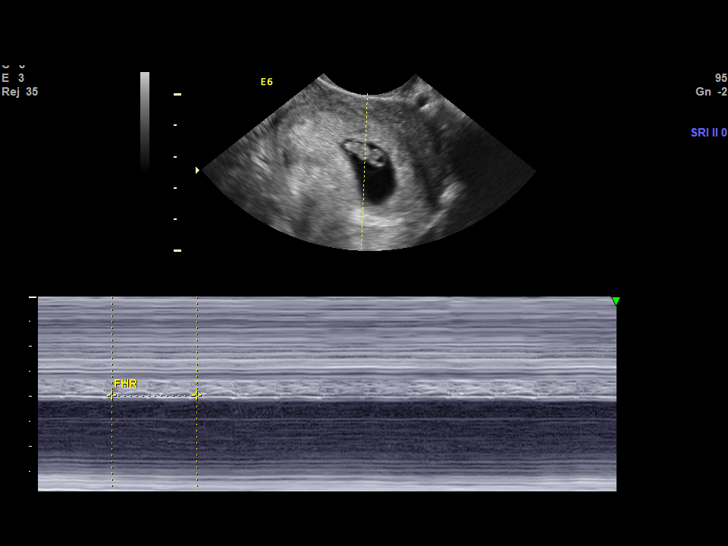
[im 11/11]
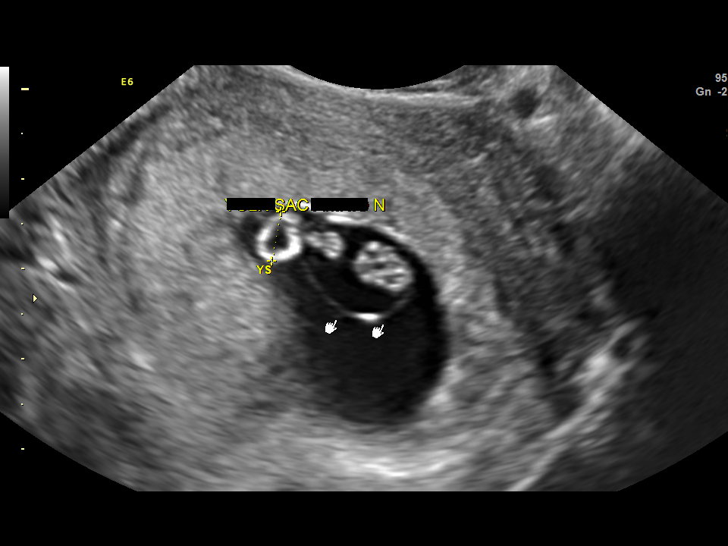

[11 of 11 positions shown; findings below may reference images not displayed]

Canned report from images found in remote index.

Refer to host system for actual result text.

## 2018-10-30 LAB — OB RESULTS CONSOLE RPR: RPR: NONREACTIVE

## 2018-10-30 LAB — OB RESULTS CONSOLE ABO/RH: RH Type: POSITIVE

## 2018-10-30 LAB — OB RESULTS CONSOLE HIV ANTIBODY (ROUTINE TESTING): HIV: NONREACTIVE

## 2018-10-30 LAB — OB RESULTS CONSOLE RUBELLA ANTIBODY, IGM: Rubella: IMMUNE

## 2018-10-30 LAB — OB RESULTS CONSOLE ANTIBODY SCREEN: Antibody Screen: NEGATIVE

## 2018-10-30 LAB — OB RESULTS CONSOLE HEPATITIS B SURFACE ANTIGEN: Hepatitis B Surface Ag: NEGATIVE

## 2018-11-29 ENCOUNTER — Other Ambulatory Visit: Payer: Self-pay

## 2018-11-29 DIAGNOSIS — Z20822 Contact with and (suspected) exposure to covid-19: Secondary | ICD-10-CM

## 2018-12-01 LAB — NOVEL CORONAVIRUS, NAA: SARS-CoV-2, NAA: NOT DETECTED

## 2019-05-08 LAB — OB RESULTS CONSOLE GBS: GBS: NEGATIVE

## 2019-05-29 ENCOUNTER — Encounter (HOSPITAL_COMMUNITY): Payer: Self-pay | Admitting: *Deleted

## 2019-05-29 ENCOUNTER — Telehealth (HOSPITAL_COMMUNITY): Payer: Self-pay | Admitting: *Deleted

## 2019-05-29 NOTE — Telephone Encounter (Signed)
Preadmission screen  

## 2019-05-30 ENCOUNTER — Encounter (HOSPITAL_COMMUNITY): Payer: Self-pay | Admitting: *Deleted

## 2019-05-31 ENCOUNTER — Other Ambulatory Visit: Payer: Self-pay | Admitting: Obstetrics and Gynecology

## 2019-06-01 ENCOUNTER — Other Ambulatory Visit (HOSPITAL_COMMUNITY)
Admission: RE | Admit: 2019-06-01 | Discharge: 2019-06-01 | Disposition: A | Discharge status: Home / Self Care | Payer: BC Managed Care – PPO | Source: Ambulatory Visit | Attending: Obstetrics and Gynecology | Admitting: Obstetrics and Gynecology

## 2019-06-01 DIAGNOSIS — Z01812 Encounter for preprocedural laboratory examination: Secondary | ICD-10-CM | POA: Insufficient documentation

## 2019-06-01 DIAGNOSIS — Z20822 Contact with and (suspected) exposure to covid-19: Secondary | ICD-10-CM | POA: Insufficient documentation

## 2019-06-01 LAB — SARS CORONAVIRUS 2 (TAT 6-24 HRS): SARS Coronavirus 2: NEGATIVE

## 2019-06-04 ENCOUNTER — Encounter (HOSPITAL_COMMUNITY)
Admission: AD | Disposition: A | Discharge status: Home / Self Care | Payer: Self-pay | Source: Home / Self Care | Attending: Obstetrics and Gynecology

## 2019-06-04 ENCOUNTER — Other Ambulatory Visit: Payer: Self-pay

## 2019-06-04 ENCOUNTER — Inpatient Hospital Stay (HOSPITAL_COMMUNITY): Payer: BC Managed Care – PPO | Admitting: Anesthesiology

## 2019-06-04 ENCOUNTER — Inpatient Hospital Stay (HOSPITAL_COMMUNITY)
Admission: AD | Admit: 2019-06-04 | Discharge: 2019-06-07 | DRG: 787 | Disposition: A | Discharge status: Home / Self Care | Payer: BC Managed Care – PPO | Attending: Obstetrics and Gynecology | Admitting: Obstetrics and Gynecology

## 2019-06-04 ENCOUNTER — Inpatient Hospital Stay (HOSPITAL_COMMUNITY): Payer: BC Managed Care – PPO

## 2019-06-04 ENCOUNTER — Encounter (HOSPITAL_COMMUNITY): Payer: Self-pay | Admitting: Obstetrics and Gynecology

## 2019-06-04 DIAGNOSIS — Z3A39 39 weeks gestation of pregnancy: Secondary | ICD-10-CM | POA: Diagnosis not present

## 2019-06-04 DIAGNOSIS — Z20822 Contact with and (suspected) exposure to covid-19: Secondary | ICD-10-CM | POA: Diagnosis present

## 2019-06-04 DIAGNOSIS — O134 Gestational [pregnancy-induced] hypertension without significant proteinuria, complicating childbirth: Secondary | ICD-10-CM | POA: Diagnosis present

## 2019-06-04 DIAGNOSIS — O9902 Anemia complicating childbirth: Secondary | ICD-10-CM | POA: Diagnosis present

## 2019-06-04 DIAGNOSIS — D62 Acute posthemorrhagic anemia: Secondary | ICD-10-CM | POA: Diagnosis not present

## 2019-06-04 DIAGNOSIS — Z3490 Encounter for supervision of normal pregnancy, unspecified, unspecified trimester: Secondary | ICD-10-CM | POA: Diagnosis present

## 2019-06-04 DIAGNOSIS — O26893 Other specified pregnancy related conditions, third trimester: Secondary | ICD-10-CM | POA: Diagnosis present

## 2019-06-04 DIAGNOSIS — O9081 Anemia of the puerperium: Secondary | ICD-10-CM | POA: Diagnosis not present

## 2019-06-04 DIAGNOSIS — O133 Gestational [pregnancy-induced] hypertension without significant proteinuria, third trimester: Secondary | ICD-10-CM

## 2019-06-04 DIAGNOSIS — I10 Essential (primary) hypertension: Secondary | ICD-10-CM | POA: Diagnosis present

## 2019-06-04 DIAGNOSIS — O139 Gestational [pregnancy-induced] hypertension without significant proteinuria, unspecified trimester: Secondary | ICD-10-CM | POA: Diagnosis present

## 2019-06-04 LAB — CBC
HCT: 25.5 % — ABNORMAL LOW (ref 36.0–46.0)
HCT: 26.7 % — ABNORMAL LOW (ref 36.0–46.0)
Hemoglobin: 8.3 g/dL — ABNORMAL LOW (ref 12.0–15.0)
Hemoglobin: 8.7 g/dL — ABNORMAL LOW (ref 12.0–15.0)
MCH: 22.7 pg — ABNORMAL LOW (ref 26.0–34.0)
MCH: 22.9 pg — ABNORMAL LOW (ref 26.0–34.0)
MCHC: 32.5 g/dL (ref 30.0–36.0)
MCHC: 32.6 g/dL (ref 30.0–36.0)
MCV: 69.7 fL — ABNORMAL LOW (ref 80.0–100.0)
MCV: 70.4 fL — ABNORMAL LOW (ref 80.0–100.0)
Platelets: 260 10*3/uL (ref 150–400)
Platelets: 260 10*3/uL (ref 150–400)
RBC: 3.62 MIL/uL — ABNORMAL LOW (ref 3.87–5.11)
RBC: 3.83 MIL/uL — ABNORMAL LOW (ref 3.87–5.11)
RDW: 15.1 % (ref 11.5–15.5)
RDW: 15.3 % (ref 11.5–15.5)
WBC: 6.6 10*3/uL (ref 4.0–10.5)
WBC: 8.7 10*3/uL (ref 4.0–10.5)
nRBC: 0 % (ref 0.0–0.2)
nRBC: 0.3 % — ABNORMAL HIGH (ref 0.0–0.2)

## 2019-06-04 LAB — COMPREHENSIVE METABOLIC PANEL
ALT: 33 U/L (ref 0–44)
AST: 39 U/L (ref 15–41)
Albumin: 2.9 g/dL — ABNORMAL LOW (ref 3.5–5.0)
Alkaline Phosphatase: 95 U/L (ref 38–126)
Anion gap: 7 (ref 5–15)
BUN: <5 mg/dL — ABNORMAL LOW (ref 6–20)
CO2: 23 mmol/L (ref 22–32)
Calcium: 8.5 mg/dL — ABNORMAL LOW (ref 8.9–10.3)
Chloride: 108 mmol/L (ref 98–111)
Creatinine, Ser: 0.71 mg/dL (ref 0.44–1.00)
GFR calc Af Amer: >60 mL/min (ref >60)
GFR calc non Af Amer: >60 mL/min (ref >60)
Glucose, Bld: 82 mg/dL (ref 70–99)
Potassium: 3.8 mmol/L (ref 3.5–5.1)
Sodium: 138 mmol/L (ref 135–145)
Total Bilirubin: 0.9 mg/dL (ref 0.3–1.2)
Total Protein: 5.5 g/dL — ABNORMAL LOW (ref 6.5–8.1)

## 2019-06-04 LAB — RPR: RPR Ser Ql: NONREACTIVE

## 2019-06-04 LAB — ABO/RH: ABO/RH(D): O POS

## 2019-06-04 LAB — TYPE AND SCREEN
ABO/RH(D): O POS
Antibody Screen: NEGATIVE

## 2019-06-04 SURGERY — Surgical Case
Anesthesia: Epidural

## 2019-06-04 MED ORDER — OXYTOCIN 40 UNITS IN NORMAL SALINE INFUSION - SIMPLE MED: 2.5000 [IU]/h | INTRAVENOUS | Status: DC

## 2019-06-04 MED ORDER — DIPHENHYDRAMINE HCL 25 MG PO CAPS
25.0000 mg | ORAL_CAPSULE | Freq: Four times a day (QID) | ORAL | Status: DC | PRN
  Administered 2019-06-05: 25 mg via ORAL

## 2019-06-04 MED ORDER — TETANUS-DIPHTH-ACELL PERTUSSIS 5-2.5-18.5 LF-MCG/0.5 IM SUSP: 0.5000 mL | Freq: Once | INTRAMUSCULAR | Status: DC

## 2019-06-04 MED ORDER — SODIUM CHLORIDE (PF) 0.9 % IJ SOLN
INTRAMUSCULAR | Status: DC | PRN
  Administered 2019-06-04: 12 mL/h via EPIDURAL

## 2019-06-04 MED ORDER — SENNOSIDES-DOCUSATE SODIUM 8.6-50 MG PO TABS
2.0000 | ORAL_TABLET | ORAL | Status: DC
  Administered 2019-06-04 – 2019-06-07 (×3): 2 via ORAL
  Filled 2019-06-04 (×2): qty 2

## 2019-06-04 MED ORDER — ZOLPIDEM TARTRATE 5 MG PO TABS: 5.0000 mg | ORAL_TABLET | Freq: Every evening | ORAL | Status: DC | PRN

## 2019-06-04 MED ORDER — MORPHINE SULFATE (PF) 0.5 MG/ML IJ SOLN
INTRAMUSCULAR | Status: AC
  Filled 2019-06-04: qty 10

## 2019-06-04 MED ORDER — NALOXONE HCL 0.4 MG/ML IJ SOLN: 0.4000 mg | INTRAMUSCULAR | Status: DC | PRN

## 2019-06-04 MED ORDER — OXYTOCIN 40 UNITS IN NORMAL SALINE INFUSION - SIMPLE MED
INTRAVENOUS | Status: DC | PRN
  Administered 2019-06-04: 250 mL via INTRAVENOUS

## 2019-06-04 MED ORDER — MISOPROSTOL 25 MCG QUARTER TABLET
25.0000 ug | ORAL_TABLET | ORAL | Status: DC
  Administered 2019-06-04: 25 ug via VAGINAL
  Filled 2019-06-04: qty 1

## 2019-06-04 MED ORDER — ACETAMINOPHEN 325 MG PO TABS: 650.0000 mg | ORAL_TABLET | ORAL | Status: DC | PRN

## 2019-06-04 MED ORDER — COCONUT OIL OIL: 1.0000 "application " | TOPICAL_OIL | Status: DC | PRN

## 2019-06-04 MED ORDER — METHYLERGONOVINE MALEATE 0.2 MG PO TABS: 0.2000 mg | ORAL_TABLET | ORAL | Status: DC | PRN

## 2019-06-04 MED ORDER — ACETAMINOPHEN 500 MG PO TABS
1000.0000 mg | ORAL_TABLET | Freq: Four times a day (QID) | ORAL | Status: AC
  Administered 2019-06-04 – 2019-06-05 (×3): 1000 mg via ORAL
  Filled 2019-06-04 (×3): qty 2

## 2019-06-04 MED ORDER — BUPIVACAINE HCL (PF) 0.25 % IJ SOLN
INTRAMUSCULAR | Status: AC
  Filled 2019-06-04: qty 30

## 2019-06-04 MED ORDER — MORPHINE SULFATE (PF) 0.5 MG/ML IJ SOLN
INTRAMUSCULAR | Status: DC | PRN
  Administered 2019-06-04: 3 mg via EPIDURAL

## 2019-06-04 MED ORDER — NALOXONE HCL 4 MG/10ML IJ SOLN
1.0000 ug/kg/h | INTRAVENOUS | Status: DC | PRN
  Filled 2019-06-04: qty 5

## 2019-06-04 MED ORDER — SODIUM CHLORIDE 0.9 % IR SOLN
Status: DC | PRN
  Administered 2019-06-04: 1000 mL

## 2019-06-04 MED ORDER — ONDANSETRON HCL 4 MG/2ML IJ SOLN
4.0000 mg | Freq: Four times a day (QID) | INTRAMUSCULAR | Status: DC | PRN
  Administered 2019-06-04: 4 mg via INTRAVENOUS

## 2019-06-04 MED ORDER — EPHEDRINE 5 MG/ML INJ: 10.0000 mg | INTRAVENOUS | Status: DC | PRN

## 2019-06-04 MED ORDER — SCOPOLAMINE 1 MG/3DAYS TD PT72
1.0000 | MEDICATED_PATCH | Freq: Once | TRANSDERMAL | Status: AC
  Administered 2019-06-04: 1.5 mg via TRANSDERMAL
  Filled 2019-06-04: qty 1

## 2019-06-04 MED ORDER — KETOROLAC TROMETHAMINE 30 MG/ML IJ SOLN
30.0000 mg | Freq: Four times a day (QID) | INTRAMUSCULAR | Status: AC | PRN
  Administered 2019-06-04: 30 mg via INTRAVENOUS

## 2019-06-04 MED ORDER — ONDANSETRON HCL 4 MG/2ML IJ SOLN
INTRAMUSCULAR | Status: AC
  Filled 2019-06-04: qty 2

## 2019-06-04 MED ORDER — LIDOCAINE-EPINEPHRINE (PF) 2 %-1:200000 IJ SOLN
INTRAMUSCULAR | Status: DC | PRN
  Administered 2019-06-04: 3 mL via EPIDURAL
  Administered 2019-06-04: 2 mL via EPIDURAL
  Administered 2019-06-04 (×3): 5 mL via EPIDURAL

## 2019-06-04 MED ORDER — KETOROLAC TROMETHAMINE 30 MG/ML IJ SOLN
30.0000 mg | Freq: Four times a day (QID) | INTRAMUSCULAR | Status: AC | PRN
  Administered 2019-06-04: 30 mg via INTRAMUSCULAR

## 2019-06-04 MED ORDER — OXYTOCIN BOLUS FROM INFUSION: 500.0000 mL | Freq: Once | INTRAVENOUS | Status: DC

## 2019-06-04 MED ORDER — BUPIVACAINE HCL (PF) 0.25 % IJ SOLN
INTRAMUSCULAR | Status: DC | PRN
  Administered 2019-06-04: 30 mL

## 2019-06-04 MED ORDER — LACTATED RINGERS IV SOLN: INTRAVENOUS | Status: DC

## 2019-06-04 MED ORDER — KETOROLAC TROMETHAMINE 30 MG/ML IJ SOLN
30.0000 mg | Freq: Four times a day (QID) | INTRAMUSCULAR | Status: AC
  Administered 2019-06-05: 12:00:00 30 mg via INTRAVENOUS
  Filled 2019-06-04 (×3): qty 1

## 2019-06-04 MED ORDER — LIDOCAINE-EPINEPHRINE (PF) 2 %-1:200000 IJ SOLN
INTRAMUSCULAR | Status: AC
  Filled 2019-06-04: qty 10

## 2019-06-04 MED ORDER — LIDOCAINE HCL (PF) 1 % IJ SOLN: 30.0000 mL | INTRAMUSCULAR | Status: DC | PRN

## 2019-06-04 MED ORDER — DIPHENHYDRAMINE HCL 50 MG/ML IJ SOLN: 12.5000 mg | INTRAMUSCULAR | Status: DC | PRN

## 2019-06-04 MED ORDER — NALBUPHINE HCL 10 MG/ML IJ SOLN: 5.0000 mg | Freq: Once | INTRAMUSCULAR | Status: DC | PRN

## 2019-06-04 MED ORDER — PHENYLEPHRINE 40 MCG/ML (10ML) SYRINGE FOR IV PUSH (FOR BLOOD PRESSURE SUPPORT): 80.0000 ug | PREFILLED_SYRINGE | INTRAVENOUS | Status: DC | PRN

## 2019-06-04 MED ORDER — OXYTOCIN 40 UNITS IN NORMAL SALINE INFUSION - SIMPLE MED: 1.0000 m[IU]/min | INTRAVENOUS | Status: DC

## 2019-06-04 MED ORDER — PRENATAL MULTIVITAMIN CH
1.0000 | ORAL_TABLET | Freq: Every day | ORAL | Status: DC
  Administered 2019-06-06: 1 via ORAL
  Filled 2019-06-04 (×2): qty 1

## 2019-06-04 MED ORDER — CEFAZOLIN SODIUM-DEXTROSE 2-3 GM-%(50ML) IV SOLR
INTRAVENOUS | Status: DC | PRN
  Administered 2019-06-04: 2 g via INTRAVENOUS

## 2019-06-04 MED ORDER — SIMETHICONE 80 MG PO CHEW
80.0000 mg | CHEWABLE_TABLET | Freq: Three times a day (TID) | ORAL | Status: DC
  Administered 2019-06-04 – 2019-06-07 (×8): 80 mg via ORAL
  Filled 2019-06-04 (×8): qty 1

## 2019-06-04 MED ORDER — NALBUPHINE HCL 10 MG/ML IJ SOLN: 5.0000 mg | INTRAMUSCULAR | Status: DC | PRN

## 2019-06-04 MED ORDER — FENTANYL CITRATE (PF) 100 MCG/2ML IJ SOLN: 25.0000 ug | INTRAMUSCULAR | Status: DC | PRN

## 2019-06-04 MED ORDER — FENTANYL CITRATE (PF) 100 MCG/2ML IJ SOLN
100.0000 ug | INTRAMUSCULAR | Status: DC | PRN
  Administered 2019-06-04 (×2): 100 ug via INTRAVENOUS
  Filled 2019-06-04 (×2): qty 2

## 2019-06-04 MED ORDER — DIBUCAINE (PERIANAL) 1 % EX OINT: 1.0000 "application " | TOPICAL_OINTMENT | CUTANEOUS | Status: DC | PRN

## 2019-06-04 MED ORDER — SODIUM CHLORIDE 0.9% FLUSH: 3.0000 mL | INTRAVENOUS | Status: DC | PRN

## 2019-06-04 MED ORDER — SOD CITRATE-CITRIC ACID 500-334 MG/5ML PO SOLN
30.0000 mL | ORAL | Status: DC | PRN
  Administered 2019-06-04: 30 mL via ORAL
  Filled 2019-06-04: qty 30

## 2019-06-04 MED ORDER — LACTATED RINGERS IV SOLN: 500.0000 mL | INTRAVENOUS | Status: DC | PRN

## 2019-06-04 MED ORDER — WITCH HAZEL-GLYCERIN EX PADS: 1.0000 "application " | MEDICATED_PAD | CUTANEOUS | Status: DC | PRN

## 2019-06-04 MED ORDER — OXYTOCIN 40 UNITS IN NORMAL SALINE INFUSION - SIMPLE MED: 2.5000 [IU]/h | INTRAVENOUS | Status: AC

## 2019-06-04 MED ORDER — PHENYLEPHRINE 40 MCG/ML (10ML) SYRINGE FOR IV PUSH (FOR BLOOD PRESSURE SUPPORT)
80.0000 ug | PREFILLED_SYRINGE | INTRAVENOUS | Status: DC | PRN
  Filled 2019-06-04: qty 10

## 2019-06-04 MED ORDER — FENTANYL-BUPIVACAINE-NACL 0.5-0.125-0.9 MG/250ML-% EP SOLN
12.0000 mL/h | EPIDURAL | Status: DC | PRN
  Filled 2019-06-04: qty 250

## 2019-06-04 MED ORDER — METHYLERGONOVINE MALEATE 0.2 MG/ML IJ SOLN: 0.2000 mg | INTRAMUSCULAR | Status: DC | PRN

## 2019-06-04 MED ORDER — TERBUTALINE SULFATE 1 MG/ML IJ SOLN
INTRAMUSCULAR | Status: AC
  Administered 2019-06-04: 0.25 mg via SUBCUTANEOUS
  Filled 2019-06-04: qty 1

## 2019-06-04 MED ORDER — ACETAMINOPHEN 10 MG/ML IV SOLN
INTRAVENOUS | Status: AC
  Filled 2019-06-04: qty 100

## 2019-06-04 MED ORDER — DIPHENHYDRAMINE HCL 25 MG PO CAPS
25.0000 mg | ORAL_CAPSULE | ORAL | Status: DC | PRN
  Filled 2019-06-04: qty 1

## 2019-06-04 MED ORDER — ONDANSETRON HCL 4 MG/2ML IJ SOLN: 4.0000 mg | Freq: Three times a day (TID) | INTRAMUSCULAR | Status: DC | PRN

## 2019-06-04 MED ORDER — MENTHOL 3 MG MT LOZG: 1.0000 | LOZENGE | OROMUCOSAL | Status: DC | PRN

## 2019-06-04 MED ORDER — TRANEXAMIC ACID-NACL 1000-0.7 MG/100ML-% IV SOLN
INTRAVENOUS | Status: DC | PRN
  Administered 2019-06-04: 1000 mg via INTRAVENOUS

## 2019-06-04 MED ORDER — KETOROLAC TROMETHAMINE 30 MG/ML IJ SOLN
INTRAMUSCULAR | Status: AC
  Filled 2019-06-04: qty 1

## 2019-06-04 MED ORDER — SIMETHICONE 80 MG PO CHEW
80.0000 mg | CHEWABLE_TABLET | ORAL | Status: DC
  Administered 2019-06-04 – 2019-06-07 (×2): 80 mg via ORAL
  Filled 2019-06-04 (×2): qty 1

## 2019-06-04 MED ORDER — LACTATED RINGERS IV SOLN: 500.0000 mL | Freq: Once | INTRAVENOUS | Status: DC

## 2019-06-04 MED ORDER — OXYCODONE-ACETAMINOPHEN 5-325 MG PO TABS: 1.0000 | ORAL_TABLET | ORAL | Status: DC | PRN

## 2019-06-04 MED ORDER — ACETAMINOPHEN 10 MG/ML IV SOLN
1000.0000 mg | Freq: Once | INTRAVENOUS | Status: DC | PRN
  Administered 2019-06-04: 14:00:00 1000 mg via INTRAVENOUS

## 2019-06-04 MED ORDER — TRANEXAMIC ACID-NACL 1000-0.7 MG/100ML-% IV SOLN
INTRAVENOUS | Status: AC
  Filled 2019-06-04: qty 100

## 2019-06-04 MED ORDER — SIMETHICONE 80 MG PO CHEW
80.0000 mg | CHEWABLE_TABLET | ORAL | Status: DC | PRN
  Administered 2019-06-06: 01:00:00 80 mg via ORAL

## 2019-06-04 MED ORDER — CEFAZOLIN SODIUM-DEXTROSE 2-4 GM/100ML-% IV SOLN
INTRAVENOUS | Status: AC
  Filled 2019-06-04: qty 100

## 2019-06-04 MED ORDER — TERBUTALINE SULFATE 1 MG/ML IJ SOLN: 0.2500 mg | Freq: Once | INTRAMUSCULAR | Status: AC

## 2019-06-04 MED ORDER — IBUPROFEN 800 MG PO TABS
800.0000 mg | ORAL_TABLET | Freq: Four times a day (QID) | ORAL | Status: DC
  Administered 2019-06-05 – 2019-06-07 (×7): 800 mg via ORAL
  Filled 2019-06-04 (×7): qty 1

## 2019-06-04 MED ORDER — TERBUTALINE SULFATE 1 MG/ML IJ SOLN
0.2500 mg | Freq: Once | INTRAMUSCULAR | Status: AC | PRN
  Administered 2019-06-04: 0.25 mg via SUBCUTANEOUS
  Filled 2019-06-04: qty 1

## 2019-06-04 SURGICAL SUPPLY — 38 items
APL SKNCLS STERI-STRIP NONHPOA (GAUZE/BANDAGES/DRESSINGS) ×1
BENZOIN TINCTURE PRP APPL 2/3 (GAUZE/BANDAGES/DRESSINGS) ×1 IMPLANT
CHLORAPREP W/TINT 26ML (MISCELLANEOUS) ×2 IMPLANT
CLAMP CORD UMBIL (MISCELLANEOUS) IMPLANT
CLOTH BEACON ORANGE TIMEOUT ST (SAFETY) ×2 IMPLANT
DECANTER SPIKE VIAL GLASS SM (MISCELLANEOUS) ×1 IMPLANT
DRSG OPSITE POSTOP 4X10 (GAUZE/BANDAGES/DRESSINGS) ×2 IMPLANT
ELECT REM PT RETURN 9FT ADLT (ELECTROSURGICAL) ×2
ELECTRODE REM PT RTRN 9FT ADLT (ELECTROSURGICAL) ×1 IMPLANT
EXTRACTOR VACUUM M CUP 4 TUBE (SUCTIONS) IMPLANT
GLOVE BIO SURGEON STRL SZ7.5 (GLOVE) ×2 IMPLANT
GLOVE BIOGEL PI IND STRL 7.0 (GLOVE) ×1 IMPLANT
GLOVE BIOGEL PI INDICATOR 7.0 (GLOVE) ×1
GOWN STRL REUS W/TWL LRG LVL3 (GOWN DISPOSABLE) ×4 IMPLANT
KIT ABG SYR 3ML LUER SLIP (SYRINGE) IMPLANT
NDL HYPO 25X5/8 SAFETYGLIDE (NEEDLE) IMPLANT
NDL SPNL 20GX3.5 QUINCKE YW (NEEDLE) IMPLANT
NEEDLE HYPO 22GX1.5 SAFETY (NEEDLE) ×3 IMPLANT
NEEDLE HYPO 25X5/8 SAFETYGLIDE (NEEDLE) IMPLANT
NEEDLE SPNL 20GX3.5 QUINCKE YW (NEEDLE) IMPLANT
NS IRRIG 1000ML POUR BTL (IV SOLUTION) ×2 IMPLANT
PACK C SECTION WH (CUSTOM PROCEDURE TRAY) ×2 IMPLANT
PENCIL SMOKE EVAC W/HOLSTER (ELECTROSURGICAL) ×2 IMPLANT
STRIP CLOSURE SKIN 1/2X4 (GAUZE/BANDAGES/DRESSINGS) ×1 IMPLANT
SUT MNCRL 0 VIOLET CTX 36 (SUTURE) ×2 IMPLANT
SUT MNCRL AB 3-0 PS2 27 (SUTURE) IMPLANT
SUT MON AB 2-0 CT1 27 (SUTURE) ×2 IMPLANT
SUT MON AB-0 CT1 36 (SUTURE) ×4 IMPLANT
SUT MONOCRYL 0 CTX 36 (SUTURE) ×6
SUT PLAIN 0 NONE (SUTURE) IMPLANT
SUT PLAIN 2 0 (SUTURE) ×2
SUT PLAIN 2 0 XLH (SUTURE) IMPLANT
SUT PLAIN ABS 2-0 CT1 27XMFL (SUTURE) IMPLANT
SYR 20CC LL (SYRINGE) IMPLANT
SYR CONTROL 10ML LL (SYRINGE) ×2 IMPLANT
TOWEL OR 17X24 6PK STRL BLUE (TOWEL DISPOSABLE) ×2 IMPLANT
TRAY FOLEY W/BAG SLVR 14FR LF (SET/KITS/TRAYS/PACK) ×2 IMPLANT
WATER STERILE IRR 1000ML POUR (IV SOLUTION) ×2 IMPLANT

## 2019-06-04 NOTE — H&P (Signed)
Cassandra Terrell is a 39 y.o. female presenting for IOL due to [facility] and history of IUFD. OB History    Gravida  5   Para  1   Term      Preterm  1   AB  3   Living  0     SAB  1   TAB  2   Ectopic      Multiple  0   Live Births             Past Medical History:  Diagnosis Date  . Abnormal Pap smear of cervix 04-20-12   ASCUS +HPV HR  . Anxiety   . Fibroid   . Hemorrhoids during pregnancy   . Lichen sclerosus    Past Surgical History:  Procedure Laterality Date  . COLPOSCOPY  05-15-13   ecc showed chronic inflammation   Family History: family history includes Aneurysm in her father; Breast cancer (age of onset: 74) in her mother; Diabetes in her maternal grandmother, mother, and paternal grandmother; Hypertension in her father; Stroke in her paternal grandmother. Social History:  reports that she has never smoked. She has never used smokeless tobacco. She reports that she does not drink alcohol or use drugs.     Maternal Diabetes: No Genetic Screening: Normal Maternal Ultrasounds/Referrals: Normal Fetal Ultrasounds or other Referrals:  None Maternal Substance Abuse:  No Significant Maternal Medications:  None Significant Maternal Lab Results:  Group B Strep negative Other Comments:  None  Review of Systems  Constitutional: Negative.   All other systems reviewed and are negative.  Maternal Medical History:  Fetal activity: Perceived fetal activity is normal.   Last perceived fetal movement was within the past hour.    Prenatal complications: no prenatal complications Prenatal Complications - Diabetes: none.    Dilation: 1.5 Effacement (%): Thick Station: -3 Exam by:: Meghan Rice, RN Blood pressure (!) 145/78, pulse 85, temperature 99 F (37.2 C), temperature source Oral, resp. rate 18, height 5\' 4"  (1.626 m), weight 85.6 kg. Maternal Exam:  Uterine Assessment: Contraction strength is mild.  Abdomen: Patient reports no abdominal tenderness. Fetal  presentation: vertex  Introitus: Normal vulva. Normal vagina.  Ferning test: negative.   Pelvis: questionable for delivery.   Cervix: Cervix evaluated by digital exam.     Physical Exam  Nursing note and vitals reviewed. Constitutional: She is oriented to person, place, and time. She appears well-developed and well-nourished.  HENT:  Head: Normocephalic and atraumatic.  Cardiovascular: Normal rate and regular rhythm.  Respiratory: Effort normal and breath sounds normal.  GI: Soft. Bowel sounds are normal.  Genitourinary:    Vulva, vagina and uterus normal.   Musculoskeletal:        General: Normal range of motion.     Cervical back: Normal range of motion and neck supple.  Neurological: She is alert and oriented to person, place, and time. She has normal reflexes.  Skin: Skin is warm and dry.  Psychiatric: She has a normal mood and affect.    Prenatal labs: ABO, Rh: --/--/O POS, O POS Performed at [facility], [place] [address], [place], [place] [address]  [phone]/27 0036) Antibody: NEG (04/27 0036) Rubella: Immune (09/22 0000) RPR: Nonreactive (09/22 0000)  HBsAg: Negative (09/22 0000)  HIV: Non-reactive (09/22 0000)  GBS: Negative/-- (03/31 0000)   Assessment/Plan: AMA History of IUFD IOL   Jolea Dolle J 06/04/2019, 6:23 AM

## 2019-06-04 NOTE — Anesthesia Preprocedure Evaluation (Signed)
Anesthesia Evaluation  Patient identified by MRN, date of birth, ID band Patient awake    Reviewed: Allergy & Precautions, NPO status , Patient's Chart, lab work & pertinent test results  Airway Mallampati: II  TM Distance: >3 FB Neck ROM: Full    Dental no notable dental hx.    Pulmonary neg pulmonary ROS,    Pulmonary exam normal breath sounds clear to auscultation       Cardiovascular negative cardio ROS Normal cardiovascular exam Rhythm:Regular Rate:Normal     Neuro/Psych negative neurological ROS  negative psych ROS   GI/Hepatic negative GI ROS, Neg liver ROS,   Endo/Other  negative endocrine ROS  Renal/GU negative Renal ROS  negative genitourinary   Musculoskeletal negative musculoskeletal ROS (+)   Abdominal   Peds  Hematology negative hematology ROS (+)   Anesthesia Other Findings IOL for AMA and IUFD  Reproductive/Obstetrics (+) Pregnancy                             Anesthesia Physical Anesthesia Plan  ASA: II  Anesthesia Plan: Epidural   Post-op Pain Management:    Induction:   PONV Risk Score and Plan: Treatment may vary due to age or medical condition  Airway Management Planned: Natural Airway  Additional Equipment:   Intra-op Plan:   Post-operative Plan:   Informed Consent: I have reviewed the patients History and Physical, chart, labs and discussed the procedure including the risks, benefits and alternatives for the proposed anesthesia with the patient or authorized representative who has indicated his/her understanding and acceptance.       Plan Discussed with: Anesthesiologist  Anesthesia Plan Comments: (Patient identified. Risks, benefits, options discussed with patient including but not limited to bleeding, infection, nerve damage, paralysis, failed block, incomplete pain control, headache, blood pressure changes, nausea, vomiting, reactions to  medication, itching, and post partum back pain. Confirmed with bedside nurse the patient's most recent platelet count. Confirmed with the patient that they are not taking any anticoagulation, have any bleeding history or any family history of bleeding disorders. Patient expressed understanding and wishes to proceed. All questions were answered. )        Anesthesia Quick Evaluation

## 2019-06-04 NOTE — Progress Notes (Signed)
Karelis Dukett is a 39 y.o. [id number] at [redacted]w[redacted]d by LMP admitted for induction of labor due to [facility] and IUFD.  Subjective: uncomfortable  Objective: BP (!) 145/75   Pulse 92   Temp 99.1 F (37.3 C) (Oral)   Resp 20   Ht 5\' 4"  (1.626 m)   Wt 85.6 kg   BMI 32.39 kg/m  No intake/output data recorded. No intake/output data recorded.  FHT:   145, pos accels, no decels UC:   regular, every 2 minutes SVE:   Dilation: 2 Effacement (%): Thick Station: -3 Exam by:: Dr. Ronita Hipps  AROM-clear  Labs: Lab Results  Component Value Date   WBC 6.6 06/04/2019   HGB 8.3 (L) 06/04/2019   HCT 25.5 (L) 06/04/2019   MCV 70.4 (L) 06/04/2019   PLT 260 06/04/2019    Assessment / Plan: Induction of labor due to [place] and history of IUFD,  progressing well on pitocin  Gestational anemia  Labor: Progressing normally Preeclampsia:  on magnesium sulfate, no signs or symptoms of toxicity and intake and ouput balanced Fetal Wellbeing:  Category I Pain Control:  IV pain meds I/D:  n/a Anticipated MOD:  NSVD  Helyn Schwan J 06/04/2019, 8:08 AM

## 2019-06-04 NOTE — Op Note (Addendum)
Cesarean Section Procedure Note  Indications: non-reassuring fetal status and inability to augment labor, History of IUFD, AMA  Pre-operative Diagnosis: 39 week 2 day pregnancy.  Post-operative Diagnosis: same  Surgeon: Lovenia Kim   Assistants: Claudette Laws, CNM  Anesthesia: Local anesthesia 0.25.% bupivacaine and Spinal anesthesia  ASA Class: 2  Procedure Details  The patient was seen in the Holding Room. The risks, benefits, complications, treatment options, and expected outcomes were discussed with the patient.  The patient concurred with the proposed plan, giving informed consent. The risks of anesthesia, infection, bleeding and possible injury to other organs discussed. Injury to bowel, bladder, or ureter with possible need for repair discussed. Possible need for transfusion with secondary risks of hepatitis or HIV acquisition discussed. Post operative complications to include but not limited to DVT, PE and Pneumonia noted. The site of surgery properly noted/marked. The patient was taken to Operating Room # C, identified as Cassandra Terrell and the procedure verified as C-Section Delivery. A Time Out was held and the above information confirmed.  After induction of anesthesia, the patient was draped and prepped in the usual sterile manner. A Pfannenstiel incision was made and carried down through the subcutaneous tissue to the fascia. Fascial incision was made and extended transversely using Mayo scissors. The fascia was separated from the underlying rectus tissue superiorly and inferiorly. The peritoneum was identified and entered. Peritoneal incision was extended longitudinally. The utero-vesical peritoneal reflection was incised transversely and the bladder flap was bluntly freed from the lower uterine segment. A low transverse uterine incision(Kerr hysterotomy) was made. Delivered from OA presentation was a  female with Apgar scores of 8 at one minute and 9 at five minutes. Bulb suctioning  gently performed. Neonatal team in attendance.After the umbilical cord was clamped and cut cord blood was obtained for evaluation. The placenta was removed intact and appeared normal. The uterus was curetted with a dry lap pack. Good hemostasis was noted.The uterine outline, tubes and ovaries appeared normal. The uterine incision was closed with running locked sutures of 0 Monocryl x 2 layers. Hemostasis was observed. The parietal peritoneum was closed with a running 2-0 Monocryl suture. The fascia was then reapproximated with running sutures of 0 Monocryl. The skin was reapproximated with 3-0 monocryl after [place] closure with 2-0 plain.  Instrument, sponge, and needle counts were correct prior the abdominal closure and at the conclusion of the case.   Findings: FTLF, OA, anterior placenta. Nl adnexa  Estimated Blood Loss:  200 mL         Drains: foley                 Specimens: placenta                 Complications:  None; patient tolerated the procedure well.         Disposition: PACU - hemodynamically stable.         Condition: stable  Attending Attestation: I performed the procedure.

## 2019-06-04 NOTE — Progress Notes (Signed)
Terbutaline given for tachysystole.

## 2019-06-04 NOTE — Progress Notes (Signed)
Cassandra Terrell is a 39 y.o. [id number] at [redacted]w[redacted]d by LMP admitted for induction of labor due to [facility], history of IUFD.  Subjective: Comfortable  Objective: BP 140/68   Pulse (!) 119   Temp 99.2 F (37.3 C) (Oral)   Resp 18   Ht 5\' 4"  (1.626 m)   Wt 85.6 kg   SpO2 100%   BMI 32.39 kg/m  No intake/output data recorded. No intake/output data recorded.  FHT:  FHR: 145 bpm, variability: moderate,  accelerations:  Present,  decelerations:  Absent UC:   irregular, every 3 minutes SVE:   Dilation: 4 Effacement (%): Thick Station: -2 Exam by:: Dr. Ronita Hipps  Labs: Lab Results  Component Value Date   WBC 8.7 06/04/2019   HGB 8.7 (L) 06/04/2019   HCT 26.7 (L) 06/04/2019   MCV 69.7 (L) 06/04/2019   PLT 260 06/04/2019    Assessment / Plan: 39+ week IUP  AMA History of IUFD Inability to augment now requiring Terbutaline for recurrent decels with regular contractions.   Labor: no progress noted, see above note Preeclampsia:  no signs or symptoms of toxicity Fetal Wellbeing:  Category I Pain Control:  Epidural I/D:  n/a Anticipated MOD:  Recommend csection due to inability to augment.Pt and husband discussing plan.   Cassandra Terrell J 06/04/2019, 12:23 PM

## 2019-06-04 NOTE — Transfer of Care (Signed)
Immediate Anesthesia Transfer of Care Note  Patient: Rendy Tabb  Procedure(s) Performed: CESAREAN SECTION (N/A )  Patient Location: PACU  Anesthesia Type:Epidural  Level of Consciousness: awake, alert  and oriented  Airway & Oxygen Therapy: Patient Spontanous Breathing  Post-op Assessment: Report given to RN and Post -op Vital signs reviewed and stable  Post vital signs: Reviewed and stable  Last Vitals:  Vitals Value Taken Time  BP 107/82 06/04/19 1352  Temp 36.8 C 06/04/19 1352  Pulse 114 06/04/19 1352  Resp 17 06/04/19 1352  SpO2 100 % 06/04/19 1352    Last Pain:  Vitals:   06/04/19 1352  TempSrc: Oral  PainSc: 0-No pain         Complications: No apparent anesthesia complications

## 2019-06-04 NOTE — Anesthesia Procedure Notes (Signed)
Epidural Patient location during procedure: OB Start time: 06/04/2019 10:00 AM End time: 06/04/2019 10:15 AM  Staffing Anesthesiologist: Freddrick March, MD Performed: anesthesiologist   Preanesthetic Checklist Completed: patient identified, IV checked, risks and benefits discussed, monitors and equipment checked, pre-op evaluation and timeout performed  Epidural Patient position: sitting Prep: DuraPrep and site prepped and draped Patient monitoring: continuous pulse ox, blood pressure, heart rate and cardiac monitor Approach: midline Location: L3-L4 Injection technique: LOR air  Needle:  Needle type: Tuohy  Needle gauge: 17 G Needle length: 9 cm Needle insertion depth: 5.5 cm Catheter type: closed end flexible Catheter size: 19 Gauge Catheter at skin depth: 11.5 cm Test dose: negative  Assessment Sensory level: T8 Events: blood not aspirated, injection not painful, no injection resistance, no paresthesia and negative IV test  Additional Notes Patient identified. Risks/Benefits/Options discussed with patient including but not limited to bleeding, infection, nerve damage, paralysis, failed block, incomplete pain control, headache, blood pressure changes, nausea, vomiting, reactions to medication both or allergic, itching and postpartum back pain. Confirmed with bedside nurse the patient's most recent platelet count. Confirmed with patient that they are not currently taking any anticoagulation, have any bleeding history or any family history of bleeding disorders. Patient expressed understanding and wished to proceed. All questions were answered. Sterile technique was used throughout the entire procedure. Please see nursing notes for vital signs. Test dose was given through epidural catheter and negative prior to continuing to dose epidural or start infusion. Warning signs of high block given to the patient including shortness of breath, tingling/numbness in hands, complete motor  block, or any concerning symptoms with instructions to call for help. Patient was given instructions on fall risk and not to get out of bed. All questions and concerns addressed with instructions to call with any issues or inadequate analgesia.  Reason for block:procedure for pain

## 2019-06-04 NOTE — Anesthesia Postprocedure Evaluation (Signed)
Anesthesia Post Note  Patient: Cassandra Terrell  Procedure(s) Performed: CESAREAN SECTION (N/A )     Patient location during evaluation: PACU Anesthesia Type: Epidural Level of consciousness: oriented and awake and alert Pain management: pain level controlled Vital Signs Assessment: post-procedure vital signs reviewed and stable Respiratory status: spontaneous breathing, respiratory function stable and patient connected to nasal cannula oxygen Cardiovascular status: blood pressure returned to baseline and stable Postop Assessment: no headache, no backache, no apparent nausea or vomiting and epidural receding Anesthetic complications: no    Last Vitals:  Vitals:   06/04/19 1445 06/04/19 1506  BP: 122/87 (!) 137/94  Pulse: (!) 103 94  Resp: 18 17  Temp:  37.2 C  SpO2: 100% 100%    Last Pain:  Vitals:   06/04/19 1506  TempSrc: Oral  PainSc:    Pain Goal:                   Miyeko Mahlum L Loveah Like

## 2019-06-05 ENCOUNTER — Encounter: Payer: Self-pay | Admitting: *Deleted

## 2019-06-05 DIAGNOSIS — O9902 Anemia complicating childbirth: Secondary | ICD-10-CM | POA: Diagnosis present

## 2019-06-05 DIAGNOSIS — O139 Gestational [pregnancy-induced] hypertension without significant proteinuria, unspecified trimester: Secondary | ICD-10-CM | POA: Diagnosis present

## 2019-06-05 DIAGNOSIS — I10 Essential (primary) hypertension: Secondary | ICD-10-CM | POA: Diagnosis present

## 2019-06-05 LAB — CBC
HCT: 22.3 % — ABNORMAL LOW (ref 36.0–46.0)
Hemoglobin: 7.2 g/dL — ABNORMAL LOW (ref 12.0–15.0)
MCH: 22.8 pg — ABNORMAL LOW (ref 26.0–34.0)
MCHC: 32.3 g/dL (ref 30.0–36.0)
MCV: 70.6 fL — ABNORMAL LOW (ref 80.0–100.0)
Platelets: 226 10*3/uL (ref 150–400)
RBC: 3.16 MIL/uL — ABNORMAL LOW (ref 3.87–5.11)
RDW: 15.2 % (ref 11.5–15.5)
WBC: 10.5 10*3/uL (ref 4.0–10.5)
nRBC: 0 % (ref 0.0–0.2)

## 2019-06-05 MED ORDER — MAGNESIUM OXIDE 400 (241.3 MG) MG PO TABS
400.0000 mg | ORAL_TABLET | Freq: Every day | ORAL | Status: DC
  Administered 2019-06-05 – 2019-06-07 (×3): 400 mg via ORAL
  Filled 2019-06-05 (×3): qty 1

## 2019-06-05 MED ORDER — NIFEDIPINE ER OSMOTIC RELEASE 30 MG PO TB24
30.0000 mg | ORAL_TABLET | Freq: Every day | ORAL | Status: DC
  Administered 2019-06-05 – 2019-06-07 (×3): 30 mg via ORAL
  Filled 2019-06-05 (×3): qty 1

## 2019-06-05 MED ORDER — SODIUM CHLORIDE 0.9 % IV SOLN
510.0000 mg | Freq: Once | INTRAVENOUS | Status: AC
  Administered 2019-06-05: 510 mg via INTRAVENOUS
  Filled 2019-06-05: qty 17

## 2019-06-05 MED ORDER — POLYSACCHARIDE IRON COMPLEX 150 MG PO CAPS
150.0000 mg | ORAL_CAPSULE | Freq: Every day | ORAL | Status: DC
  Administered 2019-06-06 – 2019-06-07 (×2): 150 mg via ORAL
  Filled 2019-06-05 (×2): qty 1

## 2019-06-05 NOTE — Lactation Note (Signed)
This note was copied from a baby's chart. Lactation Consultation Note  Patient Name: Cassandra Terrell [id number] Date: 06/05/2019 Reason for consult: Initial assessment;1st time breastfeeding Baby is 20 hours old.  This is mom's first baby.  She has a history of IUFD.  Mom states her nipples are short and she feels baby is not getting a good latch.  Baby is receiving formula feedings and was fed 1 1/2 hours ago.  Instructed to watch for cues and call for [facility] assist.  Breastfeeding consultation services and support information given and reviewed.  Maternal Data    Feeding    LATCH Score                   Interventions    Lactation Tools Discussed/Used     Consult Status Consult Status: Follow-up Date: 06/06/19 Follow-up type: In-patient    Ave Filter 06/05/2019, 1:47 PM

## 2019-06-05 NOTE — Progress Notes (Signed)
Patient ID: Francelle Shiau, female   DOB: Dec 08, 1980, 39 y.o.   MRN: [id number] Subjective: POD# 1 Live born female  Birth Weight: 8 lb 5.7 oz (3790 g) APGAR: 9, 9  Newborn Delivery   Birth date/time: 06/04/2019 13:13:00 Delivery type: C-Section, Low Transverse Trial of labor: No C-section categorization: Primary     Baby name: Shanautica Delivering provider: Brien Few   Feeding: breast and bottle  Pain control at delivery: Epidural   Reports feeling well but itchy  Patient reports tolerating PO.   Breast symptoms: minimal colostrum Pain controlled with PO meds Denies HA/SOB/C/P/N/V/dizziness. Flatus absent. She reports vaginal bleeding as normal, without clots.  She is ambulating, urinating without difficulty.     Objective:   Patient Vitals for the past 24 hrs:  BP Temp Temp src Pulse Resp SpO2  06/05/19 0313 139/89 98.9 F (37.2 C) Oral 77 16 98 %  06/04/19 2230 -- 98.8 F (37.1 C) Oral -- 16 98 %  06/04/19 1913 (!) 135/92 98.2 F (36.8 C) Oral 68 17 96 %  06/04/19 1820 (!) 143/97 98.6 F (37 C) Oral 75 17 96 %  06/04/19 1715 (!) 158/85 98.6 F (37 C) Oral 74 16 94 %  06/04/19 1600 (!) 156/91 99 F (37.2 C) Oral 89 17 97 %  06/04/19 1506 (!) 137/94 99 F (37.2 C) Oral 94 17 100 %  06/04/19 1445 122/87 -- -- (!) 103 18 100 %  06/04/19 1430 123/86 -- -- 98 17 100 %  06/04/19 1419 121/83 -- -- (!) 104 18 100 %  06/04/19 1400 122/86 -- -- (!) 107 18 100 %  06/04/19 1352 107/82 98.2 F (36.8 C) Oral (!) 114 17 100 %  06/04/19 1250 129/70 -- -- (!) 102 19 --  06/04/19 1230 (!) 138/91 -- -- (!) 115 18 --  06/04/19 1200 140/68 -- -- (!) 119 18 --  06/04/19 1130 (!) 147/74 99.2 F (37.3 C) Oral (!) 111 18 --  06/04/19 1101 (!) 141/61 -- -- 89 16 --  06/04/19 1045 135/77 -- -- 97 16 100 %  06/04/19 1040 (!) 148/82 -- -- 84 17 99 %  06/04/19 1035 (!) 141/81 -- -- 81 16 99 %  06/04/19 1030 (!) 144/80 -- -- 88 16 100 %  06/04/19 1025 (!) 141/78 -- -- 91 17 99 %   06/04/19 1020 (!) 148/81 -- -- (!) 106 18 99 %  06/04/19 1018 (!) 150/84 -- -- (!) 102 18 --  06/04/19 1014 -- -- -- -- -- 99 %  06/04/19 1011 (!) 152/84 -- -- 99 20 --  06/04/19 1009 -- -- -- -- -- 99 %      Intake/Output Summary (Last 24 hours) at 06/05/2019 0751 Last data filed at 06/05/2019 0313 Gross per 24 hour  Intake 780 ml  Output 2435 ml  Net -1655 ml        Recent Labs    06/04/19 0823 06/05/19 0442  WBC 8.7 10.5  HGB 8.7* 7.2*  HCT 26.7* 22.3*  PLT 260 226   CMP Latest Ref Rng & Units 06/04/2019  Glucose 70 - 99 mg/dL 82  BUN 6 - 20 mg/dL <5(L)  Creatinine 0.44 - 1.00 mg/dL 0.71  Sodium 135 - 145 mmol/L 138  Potassium 3.5 - 5.1 mmol/L 3.8  Chloride 98 - 111 mmol/L 108  CO2 22 - 32 mmol/L 23  Calcium 8.9 - 10.3 mg/dL 8.5(L)  Total Protein 6.5 - 8.1 g/dL 5.5(L)  Total Bilirubin  0.3 - 1.2 mg/dL 0.9  Alkaline Phos 38 - 126 U/L 95  AST 15 - 41 U/L 39  ALT 0 - 44 U/L 33     Blood type: --/--/O POS, O POS Performed at [facility] [address] [place], [place] [address]  [phone])  Rubella: Immune (09/22 0000)  Vaccines: TDaP UTD         Flu    UTD   Physical Exam:  General: alert, cooperative and no distress CV: Regular rate and rhythm Resp: clear Abdomen: soft, nontender, normal bowel sounds Incision: clean, dry and intact Uterine Fundus: firm, below umbilicus, nontender Lochia: minimal Ext: + 2 pedal and pretibial edema, no redness or tenderness in the calves or thighs      Assessment/Plan: 39 y.o.   POD# 1. [id number]                  Principal Problem:   Postpartum care following cesarean delivery (4/27) Active Problems:   Encounter for induction of labor   Cesarean delivery - NRFHT   Maternal anemia, with delivery  - ABL Anemia compounding chronic IDA                         - IV Feraheme x 1 dose now                         - Start Niferex 150mg  PO daily tomorrow                         - Magnesium oxide 400mg  PO daily     Elevated BP - gest hypertension  - labs in office and on admit wnl, BP in mild range, no PEC nural sx, good diuresis  - will rpt PEC labs in am  - start Procardia 30 xl  - continue to monitor closely, strict I&O and daily weights   Dependent edema  - TED hose  Doing well, stable.               Advance diet as tolerated Encourage rest when baby rests Breastfeeding support Encourage to ambulate Routine post-op care  Juliene Pina, CNM, MSN 06/05/2019, 7:51 AM

## 2019-06-05 NOTE — Progress Notes (Signed)
CSW received consult for hx of Anxiety.  CSW met with MOB to offer support and complete assessment.    CSW congratulated MOB on the both  of infant. CSW advised MOB of CSW's role and the reason for CSW coming to visit with her. MOB reported that she was diagnosis with anxiety after the birth of her still born in 2013. MOB reported that she was never placed in any medications or placed in therapy for this.. MOB reported that during her pregnancy she felt a little scared/anxious as she didn't want the same thing to happen again. CSW validated MOB's feeling and concerns. MOB denies feeling anxiety at this time and reports that she is feeling happy. MOB reported that she isnt feeling SI or Hi nor is she in a DV relationship at this time.   MOB reported that she has all needed items to care for infant at this time.   CSW provided education regarding the baby blues period vs. perinatal mood disorders, discussed treatment and gave resources for mental health follow up if concerns arise.  CSW recommends self-evaluation during the postpartum time period using the New Mom Checklist from Postpartum Progress and encouraged MOB to contact a medical professional if symptoms are noted at any time.   CSW provided review of Sudden Infant Death Syndrome (SIDS) precautions.   CSW identifies no further need for intervention and no barriers to discharge at this time.    Aranza Geddes S. Maika Kaczmarek, MSW, LCSW [facility] [phone]   

## 2019-06-05 NOTE — Lactation Note (Addendum)
This note was copied from a baby's chart. Lactation Consultation Note  Patient Name: Cassandra Terrell [id number] Date: 06/05/2019 Reason for consult: Follow-up assessment;Mother's request;Primapara;Difficult latch;1st time breastfeeding;Term  MOB called out for lactation assistance. Expressed concern that Baby "Cassandra Terrell" poor latch and inability to see colostrum when hand expressing. MOB requested a DEBP. [facility] student provided education on pump set-up, cleaning, and milk storage. [place] [employer] student also encouraged MOB to provide Cassandra Terrell with any EBM via gloved finger or spoon. [employer] student observed MOB pump for approximately 10 minutes. MOB reported that the 67mm flange felt like a good fit. [facility] student encouraged her to put Cassandra Terrell to the breast first, then follow-up with a 15 minute pump. MOB and FOB were actively engaged and expressed understanding.   [facility] student also provided education on paced bottle feeding and formula feeding guidelines. MOB and FOB reported that they had no further questions and will call out as needed.   Feeding Plan: Put Cassandra Terrell STS frequently  Allow Cassandra Terrell to feed at the breast first - follow-up with formula as needed Hand express/Pump 15 minutes following each feed (or if bottle feed only) - provide baby with any EBM    Maternal Data Has patient been taught Hand Expression?: Yes Does the patient have breastfeeding experience prior to this delivery?: No  Feeding Feeding Type: Formula  LATCH Score                   Interventions Interventions: Breast feeding basics reviewed;Hand express;DEBP  Lactation Tools Discussed/Used Tools: Pump Breast pump type: Double-Electric Breast Pump Pump Review: Setup, frequency, and cleaning   Consult Status Consult Status: Follow-up Date: 06/06/19 Follow-up type: In-patient    Onalee Hua 06/05/2019, 6:07 PM

## 2019-06-06 NOTE — Progress Notes (Signed)
No c/o, voiding w/o difficulty, pain controlled, +flatus, normal lochia   Patient Vitals for the past 24 hrs:  BP Temp Temp src Pulse Resp SpO2 Weight  06/06/19 0606 -- -- -- -- -- -- 80.4 kg  06/06/19 0552 138/88 98 F (36.7 C) Oral 82 18 100 % --  06/05/19 2233 130/85 97.8 F (36.6 C) Oral 63 16 100 % --  06/05/19 2216 (!) 144/85 98.1 F (36.7 C) Oral 86 16 100 % --  06/05/19 1529 139/87 -- Oral 80 16 100 % --    Intake/Output Summary (Last 24 hours) at 06/06/2019 0746 Last data filed at 06/06/2019 0600 Gross per 24 hour  Intake 960 ml  Output 2800 ml  Net -1840 ml     A&ox3 rrr ctab Abd: soft, nt, nd; fundus firm and below umb; dressing: LE: 2+ edema, nt bilat    CBC Latest Ref Rng & Units 06/05/2019 06/04/2019 06/04/2019  WBC 4.0 - 10.5 K/uL 10.5 8.7 6.6  Hemoglobin 12.0 - 15.0 g/dL 7.2(L) 8.7(L) 8.3(L)  Hematocrit 36.0 - 46.0 % 22.3(L) 26.7(L) 25.5(L)  Platelets 150 - 400 K/uL 226 260 260   A/P: pod2 s/p 1ltcs for nrfht 1. Doing well, contin care; plan d/c home tomorrow 2. GHTN- one mild range, procardia 30xl 3. anemia with acute change - s/p iv iron, begin oral iron today and contin pp, asymptomatic 4. LE edema - pt states that she had worse swelling last 3 wks or so of pregnancy, anticipate will contin to resolve

## 2019-06-06 NOTE — Lactation Note (Signed)
This note was copied from a baby's chart. Lactation Consultation Note  Patient Name: Cassandra Terrell [id number] Date: 06/06/2019 Reason for consult: Follow-up assessment;Difficult latch;Term;Primapara Mom called out for latch assist.  Assisted with positioning baby in football hold on right side.  Areolar edema noted and tissue difficult to compress.  Baby latched with a shallow latch.  Baby unable to sustain latch.  24 mm nipple shield applied.  Baby latched well and fed for 10 minutes.  Colostrum in the shield after baby finished.  Plan reviewed with mom to breastfeed with cues using nipple shield, wear breast shells between feedings, post pump x 15 minutes and supplement with expressed breast milk/formula.  Encouraged to call for assist prn.  Maternal Data    Feeding Feeding Type: Breast Fed  LATCH Score Latch: Repeated attempts needed to sustain latch, nipple held in mouth throughout feeding, stimulation needed to elicit sucking reflex.  Audible Swallowing: A few with stimulation  Type of Nipple: Everted at rest and after stimulation  Comfort (Breast/Nipple): Soft / non-tender  Hold (Positioning): Assistance needed to correctly position infant at breast and maintain latch.  LATCH Score: 7  Interventions Interventions: Breast compression;Assisted with latch;Adjust position;Skin to skin;Support pillows;Breast massage;Shells;DEBP  Lactation Tools Discussed/Used Tools: Shells;Nipple Shields Nipple shield size: 24   Consult Status Consult Status: Follow-up Date: 06/07/19 Follow-up type: In-patient    Ave Filter 06/06/2019, 12:19 PM

## 2019-06-06 NOTE — Lactation Note (Signed)
This note was copied from a baby's chart. Lactation Consultation Note  Patient Name: Cassandra Terrell [id number] Date: 06/06/2019 Reason for consult: Follow-up assessment;Term;1st time breastfeeding Baby is 44 hours old/5% weight loss.  Mom is putting baby to breast prior to formula supplementation.  She is worried baby isn't getting any milk.  Discussed colostrum and milk coming to volume.  Encouraged her to continue post pumping to stimulate milk supply.  Instructed on prevention and treatment of engorgement.  Mom denies questions.  Encouraged to call for assist prn.  Maternal Data    Feeding Feeding Type: Bottle Fed - Formula Nipple Type: Slow - flow  LATCH Score                   Interventions    Lactation Tools Discussed/Used     Consult Status Consult Status: Follow-up Date: 06/07/19 Follow-up type: In-patient    Ave Filter 06/06/2019, 9:27 AM

## 2019-06-07 MED ORDER — OXYCODONE-ACETAMINOPHEN 5-325 MG PO TABS: 1.0000 | ORAL_TABLET | ORAL | 0 refills | Status: AC | PRN

## 2019-06-07 MED ORDER — NIFEDIPINE ER 30 MG PO TB24: 30.0000 mg | ORAL_TABLET | Freq: Every day | ORAL | 11 refills | Status: DC

## 2019-06-07 MED ORDER — IBUPROFEN 800 MG PO TABS: 800.0000 mg | ORAL_TABLET | Freq: Four times a day (QID) | ORAL | 6 refills | Status: DC | PRN

## 2019-06-07 MED ORDER — POLYSACCHARIDE IRON COMPLEX 150 MG PO CAPS: 150.0000 mg | ORAL_CAPSULE | Freq: Every day | ORAL | 11 refills | Status: DC

## 2019-06-07 NOTE — Progress Notes (Signed)
SVD: primary  S:  Pt reports feeling well. Notes leg swelling/ Tolerating po/ Voiding without problems/ No n/v/ Bleeding is light/ Pain controlled withprescription NSAID's including ibuprofen (Motrin) and narcotic analgesics including oxycodone/acetaminophen (Percocet, Tylox)    O:  A & O x 3 VS: Blood pressure (!) 141/81, pulse 82, temperature 98.3 F (36.8 C), temperature source Oral, resp. rate 18, height 5\' 4"  (1.626 m), weight 80.4 kg, SpO2 100 %, unknown if currently breastfeeding.  LABS: No results found for this or any previous visit (from the past 24 hour(s)).  I&O: I/O last 3 completed shifts: In: 2820 [P.O.:2820] Out: 6800 [Urine:6800]   No intake/output data recorded.  Lungs: chest clear, no wheezing, rales, normal symmetric air entry  Heart: regular rate and rhythm, S1, S2 normal, no murmur, click, rub or gallop  Abdomen: uterus firm at umb  Primary dressing d/c/i  Perineum: is normal  Lochia: light  Extremities:edema 2+    A/P: POD # 3/PPD # 3/ [id number]  Doing well  Continue routine post partum orders  D/c instructions reviewed [facility] pp booklet given F/u 6 wk

## 2019-06-07 NOTE — Lactation Note (Signed)
This note was copied from a baby's chart. Lactation Consultation Note  Patient Name: Cassandra Terrell [id number] Date: 06/07/2019 Reason for consult: Follow-up assessment   Baby 78 hours old and latching with #24NS. Mother has viewed colostrum in NS. Discussed how to wean off NS and post pumping and giving volume back to baby. Mother has chosen to also supplement w/ formula.  Encouraged bf first to help establish her milk supply. Feed on demand with cues.  Goal 8-12+ times per day after first 24 hrs.  Place baby STS if not cueing.  Reviewed engorgement care and monitoring voids/stools.    Maternal Data    Feeding    LATCH Score                   Interventions Interventions: Breast feeding basics reviewed;DEBP  Lactation Tools Discussed/Used     Consult Status Consult Status: Complete Date: 06/07/19    Vivianne Master [facility] 06/07/2019, 10:10 AM

## 2019-06-07 NOTE — Discharge Summary (Signed)
OB Discharge Summary     Patient Name: Cassandra Terrell DOB: March 05, 1980 MRN: [id number]  Date of admission: 06/04/2019 Delivering MD: Brien Few   Date of discharge: 06/07/2019  Admitting diagnosis: Encounter for induction of labor [Z34.90] Intrauterine pregnancy: [redacted]w[redacted]d     Secondary diagnosis:  Principal Problem:   Postpartum care following cesarean delivery (4/27) Active Problems:   Encounter for induction of labor   Cesarean delivery - NRFHT   Maternal anemia, with delivery   Gestational hypertension  Additional problems: hx IUFD     Discharge diagnosis: Term Pregnancy Delivered and hx IUFD                                                                                                Post partum procedures:none  Augmentation: Pitocin  Complications: None  Hospital course:  Induction of Labor With Cesarean Section  39 y.o. yo [id number] at [redacted]w[redacted]d was admitted to the hospital 06/04/2019 for induction of labor. Patient had a labor course significant for inability to do pitoccin augmentation due to fetal tracing.. The patient went for cesarean section due to Non-Reassuring FHR, and delivered a Viable infant,06/04/2019  Membrane Rupture Time/Date: 8:04 AM ,06/04/2019   Details of operation can be found in separate operative Note.  Patient had an uncomplicated postpartum course. She is ambulating, tolerating a regular diet, passing flatus, and urinating well.  Patient is discharged home in stable condition on 06/08/19.                                    Physical exam  Vitals:   06/06/19 1040 06/06/19 1630 06/06/19 2249 06/07/19 0537  BP: (!) 153/77 138/88 139/85 (!) 141/81  Pulse:    82  Resp:  18 16 18   Temp:  98 F (36.7 C) 98.7 F (37.1 C) 98.3 F (36.8 C)  TempSrc:  Oral Oral Oral  SpO2:    100%  Weight:      Height:       General: alert, cooperative and no distress Lochia: appropriate Uterine Fundus: firm Incision: Dressing is clean, dry, and intact DVT Evaluation:  No evidence of DVT seen on physical exam. Labs: Lab Results  Component Value Date   WBC 10.5 06/05/2019   HGB 7.2 (L) 06/05/2019   HCT 22.3 (L) 06/05/2019   MCV 70.6 (L) 06/05/2019   PLT 226 06/05/2019   CMP Latest Ref Rng & Units 06/04/2019  Glucose 70 - 99 mg/dL 82  BUN 6 - 20 mg/dL <5(L)  Creatinine 0.44 - 1.00 mg/dL 0.71  Sodium 135 - 145 mmol/L 138  Potassium 3.5 - 5.1 mmol/L 3.8  Chloride 98 - 111 mmol/L 108  CO2 22 - 32 mmol/L 23  Calcium 8.9 - 10.3 mg/dL 8.5(L)  Total Protein 6.5 - 8.1 g/dL 5.5(L)  Total Bilirubin 0.3 - 1.2 mg/dL 0.9  Alkaline Phos 38 - 126 U/L 95  AST 15 - 41 U/L 39  ALT 0 - 44 U/L 33    Discharge instruction: per After Visit Summary and "  Baby and Me Booklet".  After visit meds:  Allergies as of 06/07/2019   No Known Allergies     Medication List    STOP taking these medications   metroNIDAZOLE 0.75 % vaginal gel Commonly known as: METROGEL   NuvaRing 0.12-0.015 MG/24HR vaginal ring Generic drug: etonogestrel-ethinyl estradiol     TAKE these medications   ibuprofen 800 MG tablet Commonly known as: ADVIL Take 1 tablet (800 mg total) by mouth every 6 (six) hours as needed for moderate pain.   iron polysaccharides 150 MG capsule Commonly known as: NIFEREX Take 1 capsule (150 mg total) by mouth daily.   NIFEdipine 30 MG 24 hr tablet Commonly known as: ADALAT CC Take 1 tablet (30 mg total) by mouth daily.   oxyCODONE-acetaminophen 5-325 MG tablet Commonly known as: PERCOCET/ROXICET Take 1-2 tablets by mouth every 4 (four) hours as needed for up to 7 days for moderate pain.       Diet: routine diet  Activity: Advance as tolerated. Pelvic rest for 6 weeks.   Outpatient follow up:6 weeks Follow up Appt:No future appointments. Follow up Visit:No follow-ups on file.  Postpartum contraception: Vasectomy  Newborn Data: Live born female  Birth Weight: 8 lb 5.7 oz (3790 g) APGAR: 79, 9  Newborn Delivery   Birth date/time:  06/04/2019 13:13:00 Delivery type: C-Section, Low Transverse Trial of labor: No C-section categorization: Primary      Baby Feeding: Breast Disposition:home with mother   06/07/2019 Marvene Staff, MD

## 2019-06-07 NOTE — Discharge Instructions (Signed)
Call if temperature greater than equal to 100.4, nothing per vagina for 4-6 weeks or severe nausea vomiting, increased incisional pain , drainage or redness in the incision site, no straining with bowel movements, showers no bath °

## 2022-09-20 NOTE — Progress Notes (Unsigned)
New patient visit   Patient: Cassandra Terrell   DOB: [birth year]   42 y.o. Female  MRN: [id number] Visit Date: 09/21/2022  Today's healthcare provider: Ronnald Ramp, MD   No chief complaint on file.  Subjective    Cassandra Terrell is a 42 y.o. female who presents today as a new patient to establish care.      Discussed the use of AI scribe software for clinical note transcription with the patient, who gave verbal consent to proceed.  History of Present Illness              Past Medical History:  Diagnosis Date   Abnormal Pap smear of cervix 04-20-12   ASCUS +HPV HR   Anxiety    Fibroid    Hemorrhoids during pregnancy    Lichen sclerosus    Past Surgical History:  Procedure Laterality Date   CESAREAN SECTION N/A 06/04/2019   Procedure: CESAREAN SECTION;  Surgeon: Olivia Mackie, MD;  Location: [facility] LD ORS;  Service: Obstetrics;  Laterality: N/A;   COLPOSCOPY  05-15-13   ecc showed chronic inflammation   Family Status  Relation Name Status   PGM  Deceased   PGF  Deceased   MGF  Deceased   Mother  (Not Specified)   Father  Deceased   MGM  (Not Specified)  No partnership data on file   Family History  Problem Relation Age of Onset   Diabetes Paternal Grandmother    Stroke Paternal Grandmother    Breast cancer Mother 66   Diabetes Mother    Hypertension Father    Aneurysm Father    Diabetes Maternal Grandmother    Social History   Socioeconomic History   Marital status: Divorced    Spouse name: Not on file   Number of children: Not on file   Years of education: Not on file   Highest education level: Not on file  Occupational History   Not on file  Tobacco Use   Smoking status: Never   Smokeless tobacco: Never  Substance and Sexual Activity   Alcohol use: No    Alcohol/week: 0.0 standard drinks of alcohol   Drug use: No   Sexual activity: Yes    Partners: Male    Birth control/protection: Inserts    Comment: nuvaring  Other Topics  Concern   Not on file  Social History Narrative   Not on file   Social Determinants of Health   Financial Resource Strain: Not on file  Food Insecurity: Not on file  Transportation Needs: Not on file  Physical Activity: Not on file  Stress: Not on file  Social Connections: Not on file    Outpatient Medications Prior to Visit  Medication Sig   ibuprofen (ADVIL) 800 MG tablet Take 1 tablet (800 mg total) by mouth every 6 (six) hours as needed for moderate pain.   iron polysaccharides (NIFEREX) 150 MG capsule Take 1 capsule (150 mg total) by mouth daily.   NIFEdipine (ADALAT CC) 30 MG 24 hr tablet Take 1 tablet (30 mg total) by mouth daily.   No facility-administered medications prior to visit.   No Known Allergies  Immunization History  Administered Date(s) Administered   Tdap 02/08/2011    Health Maintenance  Topic Date Due   Hepatitis C Screening  Never done   PAP SMEAR-Modifier  05/19/2018   DTaP/Tdap/Td (2 - Td or Tdap) 02/07/2021   COVID-19 Vaccine (1 - 2023-24 season) Never done   INFLUENZA VACCINE  09/08/2022  HIV Screening  Completed   HPV VACCINES  Aged Out    Patient Care Team: Patient, No Pcp Per as PCP - General (General Practice)  Review of Systems  {Insert previous labs (optional):23779} {See past labs  Heme  Chem  Endocrine  Serology  Results Review (optional):1}   Objective    There were no vitals taken for this visit. {Insert last BP/Wt (optional):23777}{See vitals history (optional):1}    Depression Screen     No data to display         No results found for any visits on 09/21/22.   Physical Exam ***    Assessment & Plan      Problem List Items Addressed This Visit   None   Assessment and Plan               No follow-ups on file.      Ronnald Ramp, MD  [facility] [phone] (phone) [phone] (fax)  [facility]

## 2022-09-21 ENCOUNTER — Ambulatory Visit: Payer: BC Managed Care – PPO | Admitting: Family Medicine

## 2022-09-21 ENCOUNTER — Encounter: Payer: Self-pay | Admitting: Family Medicine

## 2022-09-21 VITALS — BP 139/98 | HR 77 | Ht 64.0 in | Wt 165.4 lb

## 2022-09-21 DIAGNOSIS — Z0000 Encounter for general adult medical examination without abnormal findings: Secondary | ICD-10-CM

## 2022-09-21 DIAGNOSIS — I10 Essential (primary) hypertension: Secondary | ICD-10-CM

## 2022-09-21 DIAGNOSIS — D573 Sickle-cell trait: Secondary | ICD-10-CM | POA: Diagnosis not present

## 2022-09-21 DIAGNOSIS — F4322 Adjustment disorder with anxiety: Secondary | ICD-10-CM | POA: Diagnosis not present

## 2022-09-21 DIAGNOSIS — Z7689 Persons encountering health services in other specified circumstances: Secondary | ICD-10-CM

## 2022-09-21 DIAGNOSIS — R002 Palpitations: Secondary | ICD-10-CM | POA: Diagnosis not present

## 2022-09-21 NOTE — Assessment & Plan Note (Signed)
Reported hx of trait  Mother has sickle cell anemia  Patient has hx of anemia in 2021 postpartum related  cBC ordered today

## 2022-09-21 NOTE — Assessment & Plan Note (Signed)
Elevated blood pressure noted during visit (139/98). History of blood pressure fluctuations since pregnancy in 2021. No current antihypertensive medication. -Chronic, no prior medication  -CMP ordered today along with TSH  -Advise patient to monitor blood pressure at home daily, aiming for readings under 130/80. -Encourage lifestyle modifications including low sodium diet and regular exercise. -Plan to reassess in 1 month.

## 2022-09-21 NOTE — Assessment & Plan Note (Signed)
Welcomed patient to [facility]  Reviewed patient's medical history, medications, surgical and social history Discussed roles and expectations for primary care physician-patient relationship Recommended patient schedule annual preventative examinations   

## 2022-09-21 NOTE — Assessment & Plan Note (Signed)
Reports of situational anxiety related to work environment. No desire for pharmacological intervention at this time. -appears to be situational and related to work  -Recommend [email or web address] for potential therapist selection.

## 2022-09-21 NOTE — Assessment & Plan Note (Signed)
-  Recommend annual eye exam to monitor for potential hypertensive retinopathy. -Advise annual physical exam for overall health maintenance.

## 2022-09-21 NOTE — Patient Instructions (Signed)
   It was a pleasure meeting you today!  Welcome to [facility].  I look forward to taking part in your care as your new primary care physician.    Summary of our discussion today:   We will follow up with results of labs once they are available.  [email or web address] Set Boundaries, Find Peace by Harley Alto  VISIT SUMMARY:  Dear Cassandra Terrell, during your visit, we discussed your concerns about fluctuating blood pressure, heart palpitations, and anxiety. We also reviewed your medical history, including fibroids, hemorrhoids, and anemia. We discussed the importance of regular self-care, including physical examinations and eye check-ups. We also talked about your interest in exploring options for managing work-related stress and anxiety.  YOUR PLAN:  -HYPERTENSION: Hypertension is a condition where the force of the blood against the artery walls is too high. We noted elevated blood pressure during your visit. I advised you to monitor your blood pressure at home daily, aiming for readings under 130/80. I also encouraged lifestyle modifications including a low sodium diet and regular exercise. We will reassess this in 1 month.  -PALPITATIONS: Palpitations are feelings of a fast, fluttering or pounding heart. You reported intermittent palpitations, with the last episode in May. I have ordered a baseline ECG to assess your cardiac electrical function. We will reassess this if symptoms recur.  -ANXIETY: Anxiety is a feeling of unease, such as worry or fear, that can be mild or severe. You reported situational anxiety related to your work environment. I recommended [email or web address] for potential therapist selection.  -GENERAL HEALTH MAINTENANCE: General health maintenance involves taking care of your overall health. I have ordered a complete metabolic panel, CBC, and thyroid studies to assess for potential causes of palpitations. I also recommended an annual eye exam to monitor  for potential hypertensive retinopathy and an annual physical exam for overall health maintenance.  INSTRUCTIONS:  Please follow up in 1 month to review your home blood pressure readings and lab results. In the meantime, please monitor your blood pressure daily, maintain a low sodium diet and regular exercise, and consider finding a therapist through [email or web address] to help manage your work-related anxiety.  Best Wishes,   Dr. Roxan Hockey

## 2022-09-21 NOTE — Assessment & Plan Note (Signed)
Reports of intermittent palpitations, last episode in May. No associated dizziness or syncope. No current episodes. -Order baseline ECG to assess cardiac electrical function. -Plan to reassess if symptoms recur. -Order complete metabolic panel, CBC, and thyroid studies to assess for potential causes of palpitations.

## 2022-09-23 LAB — HGB FRACTIONATION CASCADE

## 2022-09-23 LAB — CMP14+EGFR
ALT: 9 IU/L (ref 0–32)
AST: 15 IU/L (ref 0–40)
Albumin: 4.4 g/dL (ref 3.9–4.9)
Alkaline Phosphatase: 57 IU/L (ref 44–121)
BUN/Creatinine Ratio: 9 (ref 9–23)
BUN: 7 mg/dL (ref 6–24)
Bilirubin Total: 0.6 mg/dL (ref 0.0–1.2)
CO2: 25 mmol/L (ref 20–29)
Calcium: 9 mg/dL (ref 8.7–10.2)
Chloride: 102 mmol/L (ref 96–106)
Creatinine, Ser: 0.82 mg/dL (ref 0.57–1.00)
Globulin, Total: 2.4 g/dL (ref 1.5–4.5)
Glucose: 83 mg/dL (ref 70–99)
Potassium: 4.4 mmol/L (ref 3.5–5.2)
Sodium: 140 mmol/L (ref 134–144)
Total Protein: 6.8 g/dL (ref 6.0–8.5)
eGFR: 91 mL/min/{1.73_m2} (ref >59)

## 2022-09-23 LAB — CBC
Hematocrit: 39.9 % (ref 34.0–46.6)
Hemoglobin: 12.8 g/dL (ref 11.1–15.9)
MCH: 26 pg — ABNORMAL LOW (ref 26.6–33.0)
MCHC: 32.1 g/dL (ref 31.5–35.7)
MCV: 81 fL (ref 79–97)
Platelets: 296 10*3/uL (ref 150–450)
RBC: 4.92 x10E6/uL (ref 3.77–5.28)
RDW: 13.8 % (ref 11.7–15.4)
WBC: 6.2 10*3/uL (ref 3.4–10.8)

## 2022-09-23 LAB — HGB FRACTIONATION BY HPLC
Hgb A2: 3.3 % — ABNORMAL HIGH (ref 1.8–3.2)
Hgb A: 62.8 % — ABNORMAL LOW (ref 96.4–98.8)
Hgb C: 33.9 % — ABNORMAL HIGH
Hgb E: 0 %
Hgb F: 0 % (ref 0.0–2.0)
Hgb S: 0 %
Hgb Variant: 0 %

## 2022-09-23 LAB — TSH+T4F+T3FREE
Free T4: 1.29 ng/dL (ref 0.82–1.77)
T3, Free: 2.8 pg/mL (ref 2.0–4.4)
TSH: 1.01 u[IU]/mL (ref 0.450–4.500)

## 2022-09-26 ENCOUNTER — Encounter: Payer: Self-pay | Admitting: Family Medicine

## 2022-09-26 DIAGNOSIS — D582 Other hemoglobinopathies: Secondary | ICD-10-CM | POA: Insufficient documentation

## 2022-10-14 ENCOUNTER — Encounter: Payer: Self-pay | Admitting: Pharmacist

## 2022-10-21 ENCOUNTER — Telehealth: Payer: Self-pay | Admitting: Family Medicine

## 2022-10-24 ENCOUNTER — Ambulatory Visit: Payer: BC Managed Care – PPO | Admitting: Family Medicine

## 2022-11-14 ENCOUNTER — Ambulatory Visit: Payer: BC Managed Care – PPO | Admitting: Family Medicine

## 2022-11-14 ENCOUNTER — Encounter: Payer: Self-pay | Admitting: Family Medicine

## 2022-11-14 VITALS — BP 129/88 | HR 81 | Temp 98.5°F | Ht 64.0 in | Wt 163.0 lb

## 2022-11-14 DIAGNOSIS — I10 Essential (primary) hypertension: Secondary | ICD-10-CM

## 2022-11-14 DIAGNOSIS — F4322 Adjustment disorder with anxiety: Secondary | ICD-10-CM | POA: Diagnosis not present

## 2022-11-14 DIAGNOSIS — Z0000 Encounter for general adult medical examination without abnormal findings: Secondary | ICD-10-CM

## 2022-11-14 MED ORDER — AMLODIPINE BESYLATE 5 MG PO TABS: 5.0000 mg | ORAL_TABLET | Freq: Every day | ORAL | 1 refills | Status: DC

## 2022-11-14 NOTE — Patient Instructions (Addendum)
VISIT SUMMARY:  During your visit, we discussed your high blood pressure and anxiety related to your work environment. We also talked about your family history of stroke and hypertension, and your daily diet. We discussed the importance of managing your blood pressure, and I recommended starting a medication called Amlodipine. We also talked about the importance of reducing your salt intake and I provided you with information about the DASH diet, which is a dietary approach to stop hypertension.  YOUR PLAN:  -HYPERTENSION: Hypertension, or high blood pressure, is a condition where the force of the blood against the artery walls is too high. It's important to manage this condition to prevent complications like heart disease and stroke. I've prescribed Amlodipine, a medication that helps lower blood pressure. Please take this medication daily and check your blood pressure 1-2 hours after taking it. Keep a record of your readings. Also, try to reduce your salt intake, especially pickles and olives, and follow the DASH diet to help manage your blood pressure. We'll follow up in 2-3 weeks to see how you're doing.  INSTRUCTIONS:  Start taking Amlodipine 5mg  daily. Check your blood pressure 1-2 hours after taking the medication and keep a record of your readings. Try to reduce your salt intake, especially pickles and olives. Follow the DASH diet, which is a dietary approach to stop hypertension. We'll follow up in 2-3 weeks to see how you're doing. Also, remember your upcoming Pap smear appointment with Dr. Billy Coast.   DASH Eating Plan DASH stands for Dietary Approaches to Stop Hypertension. The DASH eating plan is a healthy eating plan that has been shown to: Lower high blood pressure (hypertension). Reduce your risk for type 2 diabetes, heart disease, and stroke. Help with weight loss. What are tips for following this plan? Reading food labels Check food labels for the amount of salt (sodium) per  serving. Choose foods with less than 5 percent of the Daily Value (DV) of sodium. In general, foods with less than 300 milligrams (mg) of sodium per serving fit into this eating plan. To find whole grains, look for the word "whole" as the first word in the ingredient list. Shopping Buy products labeled as "low-sodium" or "no salt added." Buy fresh foods. Avoid canned foods and pre-made or frozen meals. Cooking Try not to add salt when you cook. Use salt-free seasonings or herbs instead of table salt or sea salt. Check with your health care provider or pharmacist before using salt substitutes. Do not fry foods. Cook foods in healthy ways, such as baking, boiling, grilling, roasting, or broiling. Cook using oils that are good for your heart. These include olive, canola, avocado, soybean, and sunflower oil. Meal planning  Eat a balanced diet. This should include: 4 or more servings of fruits and 4 or more servings of vegetables each day. Try to fill half of your plate with fruits and vegetables. 6-8 servings of whole grains each day. 6 or less servings of lean meat, poultry, or fish each day. 1 oz is 1 serving. A 3 oz (85 g) serving of meat is about the same size as the palm of your hand. One egg is 1 oz (28 g). 2-3 servings of low-fat dairy each day. One serving is 1 cup (237 mL). 1 serving of nuts, seeds, or beans 5 times each week. 2-3 servings of heart-healthy fats. Healthy fats called omega-3 fatty acids are found in foods such as walnuts, flaxseeds, fortified milks, and eggs. These fats are also found in cold-water  fish, such as sardines, salmon, and mackerel. Limit how much you eat of: Canned or prepackaged foods. Food that is high in trans fat, such as fried foods. Food that is high in saturated fat, such as fatty meat. Desserts and other sweets, sugary drinks, and other foods with added sugar. Full-fat dairy products. Do not salt foods before eating. Do not eat more than 4 egg yolks a  week. Try to eat at least 2 vegetarian meals a week. Eat more home-cooked food and less restaurant, buffet, and fast food. Lifestyle When eating at a restaurant, ask if your food can be made with less salt or no salt. If you drink alcohol: Limit how much you have to: 0-1 drink a day if you are female. 0-2 drinks a day if you are female. Know how much alcohol is in your drink. In the [place] one drink is one 12 oz bottle of beer (355 mL), one 5 oz glass of wine (148 mL), or one 1 oz glass of hard liquor (44 mL). General information Avoid eating more than 2,300 mg of salt a day. If you have hypertension, you may need to reduce your sodium intake to 1,500 mg a day. Work with your provider to stay at a healthy body weight or lose weight. Ask what the best weight range is for you. On most days of the week, get at least 30 minutes of exercise that causes your heart to beat faster. This may include walking, swimming, or biking. Work with your provider or dietitian to adjust your eating plan to meet your specific calorie needs. What foods should I eat? Fruits All fresh, dried, or frozen fruit. Canned fruits that are in their natural juice and do not have sugar added to them. Vegetables Fresh or frozen vegetables that are raw, steamed, roasted, or grilled. Low-sodium or reduced-sodium tomato and vegetable juice. Low-sodium or reduced-sodium tomato sauce and tomato paste. Low-sodium or reduced-sodium canned vegetables. Grains Whole-grain or whole-wheat bread. Whole-grain or whole-wheat pasta. Brown rice. Orpah Cobb. Bulgur. Whole-grain and low-sodium cereals. Pita bread. Low-fat, low-sodium crackers. Whole-wheat flour tortillas. Meats and other proteins Skinless chicken or [place]. Ground chicken or [place]. Pork with fat trimmed off. Fish and seafood. Egg whites. Dried beans, peas, or lentils. Unsalted nuts, nut butters, and seeds. Unsalted canned beans. Lean cuts of beef with fat trimmed off.  Low-sodium, lean precooked or cured meat, such as sausages or meat loaves. Dairy Low-fat (1%) or fat-free (skim) milk. Reduced-fat, low-fat, or fat-free cheeses. Nonfat, low-sodium ricotta or cottage cheese. Low-fat or nonfat yogurt. Low-fat, low-sodium cheese. Fats and oils Soft margarine without trans fats. Vegetable oil. Reduced-fat, low-fat, or light mayonnaise and salad dressings (reduced-sodium). Canola, safflower, olive, avocado, soybean, and sunflower oils. Avocado. Seasonings and condiments Herbs. Spices. Seasoning mixes without salt. Other foods Unsalted popcorn and pretzels. Fat-free sweets. The items listed above may not be all the foods and drinks you can have. Talk to a dietitian to learn more. What foods should I avoid? Fruits Canned fruit in a light or heavy syrup. Fried fruit. Fruit in cream or butter sauce. Vegetables Creamed or fried vegetables. Vegetables in a cheese sauce. Regular canned vegetables that are not marked as low-sodium or reduced-sodium. Regular canned tomato sauce and paste that are not marked as low-sodium or reduced-sodium. Regular tomato and vegetable juices that are not marked as low-sodium or reduced-sodium. Rosita Fire. Olives. Grains Baked goods made with fat, such as croissants, muffins, or some breads. Dry pasta or rice meal packs. Meats  and other proteins Fatty cuts of meat. Ribs. Fried meat. Tomasa Blase. Bologna, salami, and other precooked or cured meats, such as sausages or meat loaves, that are not lean and low in sodium. Fat from the back of a pig (fatback). Bratwurst. Salted nuts and seeds. Canned beans with added salt. Canned or smoked fish. Whole eggs or egg yolks. Chicken or [place] with skin. Dairy Whole or 2% milk, cream, and half-and-half. Whole or full-fat cream cheese. Whole-fat or sweetened yogurt. Full-fat cheese. Nondairy creamers. Whipped toppings. Processed cheese and cheese spreads. Fats and oils Butter. Stick margarine. Lard. Shortening.  Ghee. Bacon fat. Tropical oils, such as coconut, palm kernel, or palm oil. Seasonings and condiments Onion salt, garlic salt, seasoned salt, table salt, and sea salt. Worcestershire sauce. Tartar sauce. Barbecue sauce. Teriyaki sauce. Soy sauce, including reduced-sodium soy sauce. Steak sauce. Canned and packaged gravies. Fish sauce. Oyster sauce. Cocktail sauce. Store-bought horseradish. Ketchup. Mustard. Meat flavorings and tenderizers. Bouillon cubes. Hot sauces. Pre-made or packaged marinades. Pre-made or packaged taco seasonings. Relishes. Regular salad dressings. Other foods Salted popcorn and pretzels. The items listed above may not be all the foods and drinks you should avoid. Talk to a dietitian to learn more. Where to find more information National Heart, Lung, and Blood Institute (NHLBI): BuffaloDryCleaner.gl American Heart Association (AHA): heart.org Academy of Nutrition and Dietetics: eatright.org National Kidney Foundation (NKF): kidney.org This information is not intended to replace advice given to you by your health care provider. Make sure you discuss any questions you have with your health care provider. Document Revised: 02/10/2022 Document Reviewed: 02/10/2022 Elsevier Patient Education  2024 [employer].

## 2022-11-14 NOTE — Progress Notes (Signed)
Established patient visit   Patient: Cassandra Terrell   DOB: Oct 17, 1980   42 y.o. Female  MRN: [id number] Visit Date: 11/14/2022  Today's healthcare provider: Ronnald Ramp, MD   Chief Complaint  Patient presents with   Hypertension    Patient was seen about 6 weeks ago for her hypertension.  She was not put on any medication but was asked to monitor her readings at home.  Patient brings those readings in and it reveals her readings ranging mainly 130s-150s over 70s-90.  No reading over 90.  She has begun walking 2 miles per day.   Subjective     HPI     Hypertension    Additional comments: Patient was seen about 6 weeks ago for her hypertension.  She was not put on any medication but was asked to monitor her readings at home.  Patient brings those readings in and it reveals her readings ranging mainly 130s-150s over 70s-90.  No reading over 90.  She has begun walking 2 miles per day.      Last edited by Adline Peals, CMA on 11/14/2022  9:51 AM.       Discussed the use of AI scribe software for clinical note transcription with the patient, who gave verbal consent to proceed.  History of Present Illness   The patient, with a history of hypertension, presents for a follow-up visit regarding her blood pressure management. She reports that her blood pressure readings have been in the range of 130s/150s over 70s/90s. The patient has been making lifestyle modifications, including walking two miles per day, in an attempt to manage her blood pressure. However, she has not been on any blood pressure medication since her pregnancy, during which she experienced issues with hypertension.  The patient also reports experiencing anxiety related to her work environment, which has recently improved due to changes in staffing. She has not sought therapy for this anxiety. The patient has a family history of stroke and hypertension, which increases her risk for these conditions. She has  been mindful of her diet, but admits to consuming snacks high in sodium, such as pickles and olives, on a daily basis.         Past Medical History:  Diagnosis Date   Abnormal Pap smear of cervix 04-20-12   ASCUS +HPV HR   Anxiety    Fibroid    Hemorrhoids during pregnancy    Lichen sclerosus     Medications: Outpatient Medications Prior to Visit  Medication Sig   LARIN 24 FE 1-20 MG-MCG(24) tablet Take 1 tablet by mouth daily.   No facility-administered medications prior to visit.    Review of Systems  Last metabolic panel Lab Results  Component Value Date   GLUCOSE 83 09/21/2022   NA 140 09/21/2022   K 4.4 09/21/2022   CL 102 09/21/2022   CO2 25 09/21/2022   BUN 7 09/21/2022   CREATININE 0.82 09/21/2022   EGFR 91 09/21/2022   CALCIUM 9.0 09/21/2022   PROT 6.8 09/21/2022   ALBUMIN 4.4 09/21/2022   LABGLOB 2.4 09/21/2022   BILITOT 0.6 09/21/2022   ALKPHOS 57 09/21/2022   AST 15 09/21/2022   ALT 9 09/21/2022   ANIONGAP 7 06/04/2019        Objective    BP 129/88 (BP Location: Right Arm, Patient Position: Sitting, Cuff Size: Normal)   Pulse 81   Temp 98.5 F (36.9 C) (Oral)   Ht 5\' 4"  (1.626 m)  Wt 163 lb (73.9 kg)   SpO2 99%   BMI 27.98 kg/m   BP Readings from Last 3 Encounters:  11/14/22 129/88  09/21/22 (!) 139/98  06/07/19 (!) 141/81   Wt Readings from Last 3 Encounters:  11/14/22 163 lb (73.9 kg)  09/21/22 165 lb 6.4 oz (75 kg)  06/06/19 177 lb 4.8 oz (80.4 kg)       Physical Exam Vitals reviewed.  Constitutional:      General: She is not in acute distress.    Appearance: Normal appearance. She is not ill-appearing, toxic-appearing or diaphoretic.  Eyes:     Conjunctiva/sclera: Conjunctivae normal.  Cardiovascular:     Rate and Rhythm: Normal rate and regular rhythm.     Pulses: Normal pulses.     Heart sounds: Normal heart sounds. No murmur heard.    No friction rub. No gallop.  Pulmonary:     Effort: Pulmonary effort is  normal. No respiratory distress.     Breath sounds: Normal breath sounds. No stridor. No wheezing, rhonchi or rales.  Abdominal:     General: Bowel sounds are normal. There is no distension.     Palpations: Abdomen is soft.     Tenderness: There is no abdominal tenderness.  Musculoskeletal:     Right lower leg: No edema.     Left lower leg: No edema.  Skin:    Findings: No erythema or rash.  Neurological:     Mental Status: She is alert and oriented to person, place, and time.       No results found for any visits on 11/14/22.  Assessment & Plan     Problem List Items Addressed This Visit     Adjustment reaction with anxious mood   HTN (hypertension) - Primary    Blood pressure elevated at 129/88. Patient has a family history of stroke. Patient has started walking 2 miles per day. Discussed the need for medication to lower blood pressure and the goal of achieving a reading under 130/80. -Start Amlodipine 5mg  daily. -Check blood pressure 1-2 hours after taking medication and keep a record. -Reduce salt intake, specifically pickles and olives. -Provide patient with DASH diet information. -Follow-up in 2-3 weeks to assess blood pressure control.      Relevant Medications   amLODipine (NORVASC) 5 MG tablet          Return in about 3 weeks (around 12/05/2022).         Ronnald Ramp, MD  [facility]  [phone] (phone) [phone] (fax)  [facility]

## 2022-11-14 NOTE — Assessment & Plan Note (Signed)
Blood pressure elevated at 129/88. Patient has a family history of stroke. Patient has started walking 2 miles per day. Discussed the need for medication to lower blood pressure and the goal of achieving a reading under 130/80. -Start Amlodipine 5mg  daily. -Check blood pressure 1-2 hours after taking medication and keep a record. -Reduce salt intake, specifically pickles and olives. -Provide patient with DASH diet information. -Follow-up in 2-3 weeks to assess blood pressure control.

## 2022-11-14 NOTE — Assessment & Plan Note (Signed)
-  Discussed flu shot, patient declined due to previous adverse reaction. -Reminded patient of upcoming Pap smear with Dr. Rosemary Holms.

## 2022-12-06 ENCOUNTER — Ambulatory Visit: Payer: BC Managed Care – PPO | Admitting: Family Medicine

## 2022-12-13 ENCOUNTER — Encounter: Payer: Self-pay | Admitting: Family Medicine

## 2022-12-13 ENCOUNTER — Ambulatory Visit (INDEPENDENT_AMBULATORY_CARE_PROVIDER_SITE_OTHER): Payer: BC Managed Care – PPO | Admitting: Family Medicine

## 2022-12-13 VITALS — BP 134/66 | HR 81 | Temp 98.3°F | Resp 16 | Ht 64.0 in | Wt 164.4 lb

## 2022-12-13 DIAGNOSIS — I10 Essential (primary) hypertension: Secondary | ICD-10-CM

## 2022-12-13 NOTE — Assessment & Plan Note (Signed)
Improved control on Amlodipine 5mg  daily. Discussed the goal of further reducing systolic blood pressure to 124 mmHg. chronic, at goal  -Continue Amlodipine 5mg  daily. -Encourage adherence to DASH diet and regular exercise. -Plan for a physical in February.

## 2022-12-13 NOTE — Progress Notes (Signed)
Established patient visit   Patient: Cassandra Terrell   DOB: November 26, 1980   42 y.o. Female  MRN: [id number] Visit Date: 12/13/2022  Today's healthcare provider: Ronnald Ramp, MD   Chief Complaint  Patient presents with   Hypertension    Extremely tired   Subjective     HPI     Hypertension    Additional comments: Extremely tired      Last edited by Clois Comber on 12/13/2022 10:35 AM.       Discussed the use of AI scribe software for clinical note transcription with the patient, who gave verbal consent to proceed.  History of Present Illness   The patient presents with a chief complaint of fatigue, despite reportedly going to bed within the 9 o'clock hour. She is unsure of the cause of her tiredness, but speculates it may be due to the recent time change. The patient denies any other health concerns.  The patient's blood pressure has been well-controlled on amlodipine 5mg , and she has made dietary changes, including reducing intake of pickles and olives, and following the DASH diet. She reports feeling much better with these changes and the absence of dizziness.  The patient maintains an active lifestyle, walking two miles regularly. She recently took a trip to [place], where she did a lot of walking.   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 She reports no other significant stressors or concerns.         Past Medical History:  Diagnosis Date   Abnormal Pap smear of cervix 04-20-12   ASCUS +HPV HR   Anxiety    Fibroid    Hemorrhoids during pregnancy    Lichen sclerosus     Medications: Outpatient Medications Prior to Visit  Medication Sig   amLODipine (NORVASC) 5 MG tablet Take 1 tablet (5 mg total) by mouth daily.   LARIN 24 FE 1-20 MG-MCG(24) tablet Take 1 tablet by mouth daily.   No facility-administered medications prior to visit.    Review of  Systems  Last metabolic panel Lab Results  Component Value Date   GLUCOSE 83 09/21/2022   NA 140 09/21/2022   K 4.4 09/21/2022   CL 102 09/21/2022   CO2 25 09/21/2022   BUN 7 09/21/2022   CREATININE 0.82 09/21/2022   EGFR 91 09/21/2022   CALCIUM 9.0 09/21/2022   PROT 6.8 09/21/2022   ALBUMIN 4.4 09/21/2022   LABGLOB 2.4 09/21/2022   BILITOT 0.6 09/21/2022   ALKPHOS 57 09/21/2022   AST 15 09/21/2022   ALT 9 09/21/2022   ANIONGAP 7 06/04/2019        Objective    BP 134/66 (BP Location: Right Arm, Patient Position: Sitting, Cuff Size: Normal)   Pulse 81   Temp 98.3 F (36.8 C)   Resp 16   Ht 5\' 4"  (1.626 m)   Wt 164 lb 6.4 oz (74.6 kg)   SpO2 100%   BMI 28.22 kg/m  BP Readings from Last 3 Encounters:  12/13/22 134/66  11/14/22 129/88  09/21/22 (!) 139/98   Wt Readings from Last 3 Encounters:  12/13/22 164 lb 6.4 oz (74.6 kg)  11/14/22 163 lb (73.9 kg)  09/21/22 165 lb 6.4 oz (75 kg)          Physical Exam  General: Alert, no acute distress Cardio: Normal S1 and S2, RRR, no r/m/g Pulm: CTAB, normal work  of breathing Abdomen: Bowel sounds normal. Abdomen soft and non-tender.  Extremities: No peripheral edema.     No results found for any visits on 12/13/22.  Assessment & Plan     Problem List Items Addressed This Visit       Cardiovascular and Mediastinum   HTN (hypertension) - Primary    Improved control on Amlodipine 5mg  daily. Discussed the goal of further reducing systolic blood pressure to 124 mmHg. chronic, at goal  -Continue Amlodipine 5mg  daily. -Encourage adherence to DASH diet and regular exercise. -Plan for a physical in February.              Return in about 3 months (around 03/15/2023) for CPE.      Ronnald Ramp, MD  [facility] [phone] (phone) [phone] (fax)  [facility]

## 2023-02-21 LAB — HM MAMMOGRAPHY

## 2023-02-21 LAB — HM PAP SMEAR: HPV, high-risk: NEGATIVE

## 2023-03-16 ENCOUNTER — Ambulatory Visit: Payer: 59 | Admitting: Family Medicine

## 2023-03-16 ENCOUNTER — Encounter: Payer: Self-pay | Admitting: Family Medicine

## 2023-03-16 VITALS — BP 119/79 | HR 83 | Ht 64.0 in | Wt 161.0 lb

## 2023-03-16 DIAGNOSIS — Z0000 Encounter for general adult medical examination without abnormal findings: Secondary | ICD-10-CM | POA: Insufficient documentation

## 2023-03-16 DIAGNOSIS — Z0001 Encounter for general adult medical examination with abnormal findings: Secondary | ICD-10-CM | POA: Diagnosis not present

## 2023-03-16 DIAGNOSIS — Z23 Encounter for immunization: Secondary | ICD-10-CM | POA: Diagnosis not present

## 2023-03-16 DIAGNOSIS — I10 Essential (primary) hypertension: Secondary | ICD-10-CM

## 2023-03-16 DIAGNOSIS — D573 Sickle-cell trait: Secondary | ICD-10-CM | POA: Diagnosis not present

## 2023-03-16 DIAGNOSIS — Z131 Encounter for screening for diabetes mellitus: Secondary | ICD-10-CM

## 2023-03-16 DIAGNOSIS — Z13220 Encounter for screening for lipoid disorders: Secondary | ICD-10-CM

## 2023-03-16 DIAGNOSIS — D582 Other hemoglobinopathies: Secondary | ICD-10-CM

## 2023-03-16 DIAGNOSIS — Z1159 Encounter for screening for other viral diseases: Secondary | ICD-10-CM

## 2023-03-16 NOTE — Assessment & Plan Note (Signed)
 43 year old female presenting for an annual physical examination. No acute complaints. Blood pressure is well-controlled at 119/70 mmHg. Discussed general well-being, exercise, dietary habits, and stress eating. Emphasized teaching healthy eating habits to children. - Order CBC, metabolic panel, lipid panel, HbA1c, thyroid panel (TSH, T4, T3), and hepatitis C screening - Administer Tdap vaccine today - Encourage balanced meals with lean protein, vegetables, and limited sugar intake - Advise aiming for 80% healthy eating

## 2023-03-16 NOTE — Progress Notes (Signed)
 Complete physical exam   Patient: Cassandra Terrell   DOB: [birth year]   43 y.o. Female  MRN: [id number] Visit Date: 03/16/2023  Today's healthcare provider: Rockie Agent, MD   Chief Complaint  Patient presents with   Annual Exam    Pap done last month January 2025 with Dr Mauricia   Subjective    Cassandra Terrell is a 43 y.o. female who presents today for a complete physical exam.   She reports consuming a general diet.    Patient is walking for exercise      She does not have additional problems to discuss today.   Discussed the use of AI scribe software for clinical note transcription with the patient, who gave verbal consent to proceed.  History of Present Illness   Cassandra Terrell is a 43 year old female who presents for an annual physical exam.  She struggles to maintain a healthy diet, often stress eating with a preference for peanut M&Ms. A recent power outage led to an unplanned meal from [employer] instead of the salmon, asparagus, and potatoes she had purchased. She is making efforts to maintain a balanced diet, avoiding fried foods and red meat, and limiting sugar intake, although she finds it challenging.  Her family life includes a young child who wakes up early and her husband's late work hours. She is facing challenges with potty training her child, who turned [age] in December. She is aware of the importance of teaching her children healthy eating habits.  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 She notes that her work environment has improved with recent staff changes.         Past Medical History:  Diagnosis Date   Abnormal Pap smear of cervix 04-20-12   ASCUS +HPV HR   Anxiety    Fibroid    Hemorrhoids during pregnancy    Lichen sclerosus    Past Surgical History:  Procedure Laterality Date   CESAREAN SECTION N/A 06/04/2019   Procedure: CESAREAN SECTION;  Surgeon: Gorge Ade,  MD;  Location: [facility] LD ORS;  Service: Obstetrics;  Laterality: N/A;   COLPOSCOPY  05-15-13   ecc showed chronic inflammation   Social History   Socioeconomic History   Marital status: Married    Spouse name: Not on file   Number of children: Not on file   Years of education: Not on file   Highest education level: Master's degree (e.g., MA, MS, MEng, MEd, MSW, MBA)  Occupational History   Occupation: Product Manager  Tobacco Use   Smoking status: Never   Smokeless tobacco: Never  Vaping Use   Vaping status: Never Used  Substance and Sexual Activity   Alcohol use: No    Alcohol/week: 0.0 standard drinks of alcohol   Drug use: No   Sexual activity: Yes    Partners: Male    Birth control/protection: Inserts    Comment: nuvaring   Other Topics Concern   Not on file  Social History Narrative   Not on file   Social Drivers of Health   Financial Resource Strain: Low Risk  (09/20/2022)   Overall Financial Resource Strain (CARDIA)    Difficulty of Paying Living Expenses: Not hard at all  Food Insecurity: No Food Insecurity (03/16/2023)   Hunger Vital Sign    Worried About Running Out of Food in the Last Year: Never true    Ran Out of Food in the Last Year: Never  true  Transportation Needs: No Transportation Needs (03/16/2023)   PRAPARE - Administrator, Civil Service (Medical): No    Lack of Transportation (Non-Medical): No  Physical Activity: Unknown (09/20/2022)   Exercise Vital Sign    Days of Exercise per Week: 0 days    Minutes of Exercise per Session: Not on file  Stress: No Stress Concern Present (09/20/2022)   [employer] of Occupational Health - Occupational Stress Questionnaire    Feeling of Stress : Only a little  Social Connections: Socially Integrated (09/20/2022)   Social Connection and Isolation Panel [NHANES]    Frequency of Communication with Friends and Family: More than three times a week    Frequency of Social Gatherings with Friends and  Family: More than three times a week    Attends Religious Services: More than 4 times per year    Active Member of [employer] or Organizations: Yes    Attends Banker Meetings: 1 to 4 times per year    Marital Status: Married  Catering Manager Violence: Not At Risk (03/16/2023)   Humiliation, Afraid, Rape, and Kick questionnaire    Fear of Current or Ex-Partner: No    Emotionally Abused: No    Physically Abused: No    Sexually Abused: No   Family Status  Relation Name Status   Mother  Alive   Father  Deceased   MGM  (Not Specified)   MGF  Deceased   PGM  Deceased   PGF  Deceased  No partnership data on file   Family History  Problem Relation Age of Onset   Breast cancer Mother 86   Diabetes Mother    Hypertension Father    Aneurysm Father    Diabetes Maternal Grandmother    Diabetes Paternal Grandmother    Stroke Paternal Grandmother    No Known Allergies   Medications: Outpatient Medications Prior to Visit  Medication Sig   amLODipine  (NORVASC ) 5 MG tablet Take 1 tablet (5 mg total) by mouth daily.   LARIN 24 FE 1-20 MG-MCG(24) tablet Take 1 tablet by mouth daily.   No facility-administered medications prior to visit.    Review of Systems  Last CBC Lab Results  Component Value Date   WBC 6.2 09/21/2022   HGB 12.8 09/21/2022   HCT 39.9 09/21/2022   MCV 81 09/21/2022   MCH 26.0 (L) 09/21/2022   RDW 13.8 09/21/2022   PLT 296 09/21/2022   Last metabolic panel Lab Results  Component Value Date   GLUCOSE 83 09/21/2022   NA 140 09/21/2022   K 4.4 09/21/2022   CL 102 09/21/2022   CO2 25 09/21/2022   BUN 7 09/21/2022   CREATININE 0.82 09/21/2022   EGFR 91 09/21/2022   CALCIUM 9.0 09/21/2022   PROT 6.8 09/21/2022   ALBUMIN 4.4 09/21/2022   LABGLOB 2.4 09/21/2022   BILITOT 0.6 09/21/2022   ALKPHOS 57 09/21/2022   AST 15 09/21/2022   ALT 9 09/21/2022   ANIONGAP 7 06/04/2019   Last lipids No results found for: CHOL, HDL, LDLCALC,  LDLDIRECT, TRIG, CHOLHDL Last hemoglobin A1c No results found for: HGBA1C Last thyroid functions Lab Results  Component Value Date   TSH 1.010 09/21/2022       Objective    BP 119/79   Pulse 83   Ht 5' 4 (1.626 m)   Wt 161 lb (73 kg)   LMP 02/23/2023   BMI 27.64 kg/m  BP Readings from Last 3 Encounters:  03/16/23 119/79  12/13/22 134/66  11/14/22 129/88   Wt Readings from Last 3 Encounters:  03/16/23 161 lb (73 kg)  12/13/22 164 lb 6.4 oz (74.6 kg)  11/14/22 163 lb (73.9 kg)        Physical Exam Vitals reviewed.  Constitutional:      General: She is not in acute distress.    Appearance: Normal appearance. She is not ill-appearing, toxic-appearing or diaphoretic.  HENT:     Head: Normocephalic and atraumatic.     Right Ear: Tympanic membrane and external ear normal. There is no impacted cerumen.     Left Ear: Tympanic membrane and external ear normal. There is no impacted cerumen.     Nose: Nose normal.     Mouth/Throat:     Pharynx: Oropharynx is clear.  Eyes:     General: No scleral icterus.    Extraocular Movements: Extraocular movements intact.     Conjunctiva/sclera: Conjunctivae normal.     Pupils: Pupils are equal, round, and reactive to light.  Cardiovascular:     Rate and Rhythm: Normal rate and regular rhythm.     Pulses: Normal pulses.     Heart sounds: Normal heart sounds. No murmur heard.    No friction rub. No gallop.  Pulmonary:     Effort: Pulmonary effort is normal. No respiratory distress.     Breath sounds: Normal breath sounds. No wheezing, rhonchi or rales.  Abdominal:     General: Bowel sounds are normal. There is no distension.     Palpations: Abdomen is soft. There is no mass.     Tenderness: There is no abdominal tenderness. There is no guarding.  Musculoskeletal:        General: No deformity.     Cervical back: Normal range of motion and neck supple. No rigidity.     Right lower leg: No edema.     Left lower leg: No  edema.  Lymphadenopathy:     Cervical: No cervical adenopathy.  Skin:    General: Skin is warm.     Capillary Refill: Capillary refill takes less than 2 seconds.     Findings: No erythema or rash.  Neurological:     General: No focal deficit present.     Mental Status: She is alert and oriented to person, place, and time.     Motor: No weakness.     Gait: Gait normal.  Psychiatric:        Mood and Affect: Mood normal.        Behavior: Behavior normal.       Last depression screening scores    03/16/2023    8:11 AM 12/13/2022   10:36 AM  PHQ 2/9 Scores  PHQ - 2 Score 0 0    Last fall risk screening     No data to display          Last Audit-C alcohol use screening    09/20/2022    4:50 PM  Alcohol Use Disorder Test (AUDIT)  1. How often do you have a drink containing alcohol? 1  2. How many drinks containing alcohol do you have on a typical day when you are drinking? 0  3. How often do you have six or more drinks on one occasion? 0  AUDIT-C Score 1      Patient-reported   A score of 3 or more in women, and 4 or more in men indicates increased risk for alcohol abuse, EXCEPT if all of the  points are from question 1   No results found for any visits on 03/16/23.  Assessment & Plan    Routine Health Maintenance and Physical Exam  Immunization History  Administered Date(s) Administered   Influenza-Unspecified 11/30/2018   Tdap 02/08/2011, 03/16/2023    Health Maintenance  Topic Date Due   Hepatitis C Screening  Never done   Cervical Cancer Screening (HPV/Pap Cotest)  05/19/2018   COVID-19 Vaccine (1 - 2024-25 season) 04/01/2023 (Originally 10/09/2022)   INFLUENZA VACCINE  05/08/2023 (Originally 09/08/2022)   DTaP/Tdap/Td (3 - Td or Tdap) 03/15/2033   HIV Screening  Completed   HPV VACCINES  Aged Out    Problem List Items Addressed This Visit       Cardiovascular and Mediastinum   HTN (hypertension)   Relevant Orders   CMP14+EGFR   TSH+T4F+T3Free      Other   Sickle cell trait (HCC)   Relevant Orders   CBC   Hemoglobin C trait (HCC)   Relevant Orders   CBC   Annual physical exam - Primary   43 year old female presenting for an annual physical examination. No acute complaints. Blood pressure is well-controlled at 119/70 mmHg. Discussed general well-being, exercise, dietary habits, and stress eating. Emphasized teaching healthy eating habits to children. - Order CBC, metabolic panel, lipid panel, HbA1c, thyroid panel (TSH, T4, T3), and hepatitis C screening - Administer Tdap vaccine today - Encourage balanced meals with lean protein, vegetables, and limited sugar intake - Advise aiming for 80% healthy eating          Other Visit Diagnoses       Diabetes mellitus screening       Relevant Orders   Hemoglobin A1c     Screening for lipid disorders       Relevant Orders   Lipid panel     Encounter for hepatitis C screening test for low risk patient       Relevant Orders   Hepatitis C antibody     Need for Tdap vaccination       Relevant Orders   Tdap vaccine greater than or equal to 7yo IM (Completed)            Return in about 6 months (around 09/13/2023) for HTN.       Rockie Agent, MD  [facility] [phone] (phone) [phone] (fax)  [facility]

## 2023-03-17 ENCOUNTER — Encounter: Payer: Self-pay | Admitting: Family Medicine

## 2023-03-17 LAB — CMP14+EGFR
ALT: 9 [IU]/L (ref 0–32)
AST: 15 [IU]/L (ref 0–40)
Albumin: 4.3 g/dL (ref 3.9–4.9)
Alkaline Phosphatase: 62 [IU]/L (ref 44–121)
BUN/Creatinine Ratio: 8 — ABNORMAL LOW (ref 9–23)
BUN: 6 mg/dL (ref 6–24)
Bilirubin Total: 0.6 mg/dL (ref 0.0–1.2)
CO2: 23 mmol/L (ref 20–29)
Calcium: 9.2 mg/dL (ref 8.7–10.2)
Chloride: 104 mmol/L (ref 96–106)
Creatinine, Ser: 0.77 mg/dL (ref 0.57–1.00)
Globulin, Total: 2.3 g/dL (ref 1.5–4.5)
Glucose: 85 mg/dL (ref 70–99)
Potassium: 4.2 mmol/L (ref 3.5–5.2)
Sodium: 142 mmol/L (ref 134–144)
Total Protein: 6.6 g/dL (ref 6.0–8.5)
eGFR: 97 mL/min/{1.73_m2} (ref >59)

## 2023-03-17 LAB — TSH+T4F+T3FREE
Free T4: 1.2 ng/dL (ref 0.82–1.77)
T3, Free: 2.9 pg/mL (ref 2.0–4.4)
TSH: 1.05 u[IU]/mL (ref 0.450–4.500)

## 2023-03-17 LAB — CBC
Hematocrit: 38.2 % (ref 34.0–46.6)
Hemoglobin: 12.1 g/dL (ref 11.1–15.9)
MCH: 25.7 pg — ABNORMAL LOW (ref 26.6–33.0)
MCHC: 31.7 g/dL (ref 31.5–35.7)
MCV: 81 fL (ref 79–97)
Platelets: 314 10*3/uL (ref 150–450)
RBC: 4.71 x10E6/uL (ref 3.77–5.28)
RDW: 13.9 % (ref 11.7–15.4)
WBC: 4 10*3/uL (ref 3.4–10.8)

## 2023-03-17 LAB — LIPID PANEL
Chol/HDL Ratio: 2.4 {ratio} (ref 0.0–4.4)
Cholesterol, Total: 164 mg/dL (ref 100–199)
HDL: 68 mg/dL (ref >39)
LDL Chol Calc (NIH): 86 mg/dL (ref 0–99)
Triglycerides: 46 mg/dL (ref 0–149)
VLDL Cholesterol Cal: 10 mg/dL (ref 5–40)

## 2023-03-17 LAB — HEMOGLOBIN A1C
Est. average glucose Bld gHb Est-mCnc: 117 mg/dL
Hgb A1c MFr Bld: 5.7 % — ABNORMAL HIGH (ref 4.8–5.6)

## 2023-03-17 LAB — HEPATITIS C ANTIBODY: Hep C Virus Ab: NONREACTIVE

## 2023-03-29 ENCOUNTER — Encounter: Payer: Self-pay | Admitting: Family Medicine

## 2023-03-29 ENCOUNTER — Ambulatory Visit (INDEPENDENT_AMBULATORY_CARE_PROVIDER_SITE_OTHER): Payer: 59 | Admitting: Family Medicine

## 2023-03-29 VITALS — BP 134/88 | HR 81 | Ht 64.0 in | Wt 161.0 lb

## 2023-03-29 DIAGNOSIS — L03114 Cellulitis of left upper limb: Secondary | ICD-10-CM | POA: Diagnosis not present

## 2023-03-29 MED ORDER — DOXYCYCLINE HYCLATE 100 MG PO TABS: 100.0000 mg | ORAL_TABLET | Freq: Two times a day (BID) | ORAL | 0 refills | Status: AC

## 2023-03-29 NOTE — Progress Notes (Unsigned)
Established patient visit   Patient: Cassandra Terrell   DOB: Jun 21, 1980   43 y.o. Female  MRN: [id number] Visit Date: 03/29/2023  Today's healthcare provider: Ronnald Ramp, MD   Chief Complaint  Patient presents with  . Insect Bite    Multiple spots   Subjective     HPI     Insect Bite    Additional comments: Multiple spots      Last edited by Judd Gaudier, CMA on 03/29/2023 10:00 AM.       Discussed the use of AI scribe software for clinical note transcription with the patient, who gave verbal consent to proceed.  History of Present Illness   Cassandra Terrell is a 43 year old female who presents with concerns for a spider bite.  She has developed multiple bites, initially thought to be a spider bite, which began after her last visit. Itching on her arm started the night of her last visit, initially attributed to a shot. The following morning, a bite appeared on her arm, fluctuating in size, initially decreasing but then increasing again, causing significant itching, particularly at work.  Additional bites have developed, including three on her arm, one under her foot, one on her leg, and one on her heel. The bites are itchy but not painful. One bite on her arm is warm to the touch and developed a 'puss pocket' two nights ago. Cool air alleviates the itching, whereas covering the area with clothing exacerbates it. No systemic symptoms such as joint pain, flu-like symptoms, heart racing, fevers, or chills.  The bites occur during her sleep and affect only her, despite cleaning her room and removing items. No similar symptoms are noted in her family, including her husband and daughter, who have slept in the same room. She recalls a recent stay at a hotel in [place], which she suspects might be related to the bites.         Past Medical History:  Diagnosis Date  . Abnormal Pap smear of cervix 04-20-12   ASCUS +HPV HR  . Anxiety   . Fibroid   .  Hemorrhoids during pregnancy   . Lichen sclerosus     Medications: Outpatient Medications Prior to Visit  Medication Sig  . amLODipine (NORVASC) 5 MG tablet Take 1 tablet (5 mg total) by mouth daily.  Marland Kitchen LARIN 24 FE 1-20 MG-MCG(24) tablet Take 1 tablet by mouth daily.   No facility-administered medications prior to visit.    Review of Systems  Last CBC Lab Results  Component Value Date   WBC 4.0 03/16/2023   HGB 12.1 03/16/2023   HCT 38.2 03/16/2023   MCV 81 03/16/2023   MCH 25.7 (L) 03/16/2023   RDW 13.9 03/16/2023   PLT 314 03/16/2023   Last metabolic panel Lab Results  Component Value Date   GLUCOSE 85 03/16/2023   NA 142 03/16/2023   K 4.2 03/16/2023   CL 104 03/16/2023   CO2 23 03/16/2023   BUN 6 03/16/2023   CREATININE 0.77 03/16/2023   EGFR 97 03/16/2023   CALCIUM 9.2 03/16/2023   PROT 6.6 03/16/2023   ALBUMIN 4.3 03/16/2023   LABGLOB 2.3 03/16/2023   BILITOT 0.6 03/16/2023   ALKPHOS 62 03/16/2023   AST 15 03/16/2023   ALT 9 03/16/2023   ANIONGAP 7 06/04/2019     {See past labs  Heme  Chem  Endocrine  Serology  Results Review (optional):1}   Objective    BP 134/88  Pulse 81   Ht 5\' 4"  (1.626 m)   Wt 161 lb (73 kg)   LMP 02/23/2023   BMI 27.64 kg/m  BP Readings from Last 3 Encounters:  03/29/23 134/88  03/16/23 119/79  12/13/22 134/66   Wt Readings from Last 3 Encounters:  03/29/23 161 lb (73 kg)  03/16/23 161 lb (73 kg)  12/13/22 164 lb 6.4 oz (74.6 kg)    {See vitals history (optional):1}    Physical Exam  Physical Exam   SKIN: Annular rash with central unroofed ulcer on the right upper extremity, approximately 5 inches in diameter, with an erythematous border and warm to touch.       No results found for any visits on 03/29/23.  Assessment & Plan     Problem List Items Addressed This Visit   None Visit Diagnoses       Cellulitis of left upper extremity    -  Primary   Relevant Medications   doxycycline (VIBRA-TABS)  100 MG tablet           Suspected Insect Bites with Secondary Skin Infection 43 year old female presents with multiple pruritic lesions, primarily on the right upper extremity, leg, heel, and foot. The most concerning lesion is on the posterior right upper extremity, approximately 5 inches in diameter, annular with a central unroofed ulcer, erythematous border, and warmth. No systemic symptoms such as fever, chills, or joint pain. Differential includes spider bites, mosquito bites, and other insect bites. The lesion's warmth and size suggest secondary bacterial infection. Patient reports improvement but still has significant symptoms.Advised to monitor for signs of worsening infection and to return if symptoms escalate. acute - Prescribe doxycycline 100 mg twice daily for 10 days - Counsel to return if area darkens, continues to spread, pain worsens, or if she develops fevers or chills.         No follow-ups on file.         Ronnald Ramp, MD  [facility] [phone] (phone) [phone] (fax)  [facility]

## 2023-03-29 NOTE — Patient Instructions (Signed)
VISIT SUMMARY:  Today, we discussed your concerns about multiple itchy bites that you initially thought were spider bites. These bites have appeared on various parts of your body, including your arm, leg, heel, and foot, and have been causing significant itching, especially at work. One of the bites on your arm has developed a 'puss pocket' and is warm to the touch. You mentioned that these bites started after your recent stay at a hotel in [place].  YOUR PLAN:  -SUSPECTED INSECT BITES WITH SECONDARY SKIN INFECTION: You have multiple itchy bites that are likely from insects, and one of the bites on your arm appears to have a secondary bacterial infection. This means that bacteria have entered the bite, causing it to become warm, red, and swollen. We have prescribed doxycycline 100 mg to be taken twice daily for 10 days to treat the infection. Please monitor the bites and return if the area darkens, continues to spread, the pain worsens, or if you develop fevers or chills.  INSTRUCTIONS:  Please take doxycycline 100 mg twice daily for 10 days. Monitor the bites closely and return to the clinic if the area darkens, continues to spread, the pain worsens, or if you develop fevers or chills.

## 2023-05-08 ENCOUNTER — Other Ambulatory Visit: Payer: Self-pay | Admitting: Family Medicine

## 2023-05-08 DIAGNOSIS — I10 Essential (primary) hypertension: Secondary | ICD-10-CM

## 2023-08-06 ENCOUNTER — Other Ambulatory Visit: Payer: Self-pay | Admitting: Family Medicine

## 2023-08-06 DIAGNOSIS — I10 Essential (primary) hypertension: Secondary | ICD-10-CM

## 2023-09-13 ENCOUNTER — Ambulatory Visit: Payer: 59 | Admitting: Family Medicine

## 2023-10-03 ENCOUNTER — Other Ambulatory Visit: Payer: Self-pay | Admitting: Medical Genetics

## 2023-10-10 ENCOUNTER — Encounter: Payer: Self-pay | Admitting: Family Medicine

## 2023-10-10 ENCOUNTER — Ambulatory Visit (INDEPENDENT_AMBULATORY_CARE_PROVIDER_SITE_OTHER): Admitting: Family Medicine

## 2023-10-10 VITALS — BP 138/82 | HR 77 | Ht 64.5 in | Wt 162.0 lb

## 2023-10-10 DIAGNOSIS — F4322 Adjustment disorder with anxiety: Secondary | ICD-10-CM

## 2023-10-10 DIAGNOSIS — Z111 Encounter for screening for respiratory tuberculosis: Secondary | ICD-10-CM

## 2023-10-10 DIAGNOSIS — I10 Essential (primary) hypertension: Secondary | ICD-10-CM

## 2023-10-10 MED ORDER — AMLODIPINE BESYLATE 10 MG PO TABS: 10.0000 mg | ORAL_TABLET | Freq: Every day | ORAL | 1 refills | Status: DC

## 2023-10-10 NOTE — Progress Notes (Unsigned)
 Established patient visit   Patient: Cassandra Terrell   DOB: February 01, 1981   43 y.o. Female  MRN: [id number] Visit Date: 10/10/2023  Today's healthcare provider: Rockie Agent, MD   Chief Complaint  Patient presents with   Medical Management of Chronic Issues    Follow-up HTN Patient would like to get TB test. 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   Subjective     HPI     Medical Management of Chronic Issues    Additional comments: Follow-up HTN Patient would like to get TB test. 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      Last edited by Rosas, Joseline E, CMA on 10/10/2023 10:09 AM.       Discussed the use of AI scribe software for clinical note transcription with the patient, who gave verbal consent to proceed.  History of Present Illness Cassandra Terrell is a 43 year old female with chronic hypertension who presents for a follow-up on her blood pressure management.  She is managing her chronic hypertension with amlodipine  5mg  daily. She experiences no headaches, vision changes, or peripheral edema. 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 She currently has two residents who have developmental challenges. She also shares her personal experience with her daughter's first day at pre-K, highlighting the emotional aspect of this milestone. She states that overall she is managing stress well and is enjoying her activities with the group home residents.     Past Medical History:  Diagnosis Date   Abnormal Pap smear of cervix 04-20-12   ASCUS +HPV HR   Anxiety    Fibroid     Hemorrhoids during pregnancy    Lichen sclerosus     Medications: Outpatient Medications Prior to Visit  Medication Sig   amLODipine  (NORVASC ) 5 MG tablet Take 1 tablet by mouth once daily   LARIN 24 FE 1-20 MG-MCG(24) tablet Take 1 tablet by mouth daily.   No facility-administered medications prior to visit.    Review of Systems  Last CBC Lab Results  Component Value Date   WBC 4.0 03/16/2023   HGB 12.1 03/16/2023   HCT 38.2 03/16/2023   MCV 81 03/16/2023   MCH 25.7 (L) 03/16/2023   RDW 13.9 03/16/2023   PLT 314 03/16/2023   Last metabolic panel Lab Results  Component Value Date   GLUCOSE 85 03/16/2023   NA 142 03/16/2023   K 4.2 03/16/2023   CL 104 03/16/2023   CO2 23 03/16/2023   BUN 6 03/16/2023   CREATININE 0.77 03/16/2023   EGFR 97 03/16/2023   CALCIUM 9.2 03/16/2023   PROT 6.6 03/16/2023   ALBUMIN 4.3 03/16/2023   LABGLOB 2.3 03/16/2023   BILITOT 0.6 03/16/2023   ALKPHOS 62 03/16/2023   AST 15 03/16/2023   ALT 9 03/16/2023   ANIONGAP 7 06/04/2019   Last lipids Lab Results  Component Value Date   CHOL 164 03/16/2023   HDL 68 03/16/2023   LDLCALC 86 03/16/2023   TRIG 46 03/16/2023   CHOLHDL 2.4 03/16/2023   Last hemoglobin A1c Lab  Results  Component Value Date   HGBA1C 5.7 (H) 03/16/2023   Last thyroid functions Lab Results  Component Value Date   TSH 1.050 03/16/2023        Objective    BP 138/82 (BP Location: Left Arm, Patient Position: Sitting, Cuff Size: Normal)   Pulse 77   Ht 5' 4.5 (1.638 m)   Wt 162 lb (73.5 kg)   SpO2 99%   BMI 27.38 kg/m  BP Readings from Last 3 Encounters:  10/10/23 138/82  03/29/23 134/88  03/16/23 119/79   Wt Readings from Last 3 Encounters:  10/10/23 162 lb (73.5 kg)  03/29/23 161 lb (73 kg)  03/16/23 161 lb (73 kg)        Physical Exam  Physical Exam VITALS: BP- 138/82  General: Alert, no acute distress Cardio: RRR, no r/m/g Pulm: CTAB, normal work of breathing     No results  found for any visits on 10/10/23.  Assessment & Plan     Problem List Items Addressed This Visit     HTN (hypertension) - Primary   Relevant Medications   amLODipine  (NORVASC ) 10 MG tablet   Other Visit Diagnoses       Screening-pulmonary TB       Relevant Orders   QuantiFERON-TB Gold Plus        Assessment & Plan       Return in about 1 month (around 11/09/2023) for HTN.         Rockie Agent, MD  [facility] [phone] (phone) [phone] (fax)  [facility]

## 2023-10-11 NOTE — Assessment & Plan Note (Signed)
 Chronic hypertension with current blood pressure of 138/82 mmHg, borderline elevated. No symptoms such as headaches or vision changes. Current management with amlodipine  5 mg daily. - Increase amlodipine  to 10 mg daily, taken as two 5 mg tablets, to achieve target blood pressure in the high 120s/70s range. - Discussed potential side effect of peripheral edema with increased dosage. - Monitor for swelling in feet and legs. - Contact clinic if swelling occurs or if there are any issues with the increased dosage.

## 2023-10-11 NOTE — Assessment & Plan Note (Signed)
 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

## 2023-10-12 ENCOUNTER — Ambulatory Visit: Payer: Self-pay | Admitting: Family Medicine

## 2023-10-12 ENCOUNTER — Other Ambulatory Visit

## 2023-10-12 DIAGNOSIS — Z006 Encounter for examination for normal comparison and control in clinical research program: Secondary | ICD-10-CM

## 2023-10-12 LAB — QUANTIFERON-TB GOLD PLUS
QuantiFERON Mitogen Value: >10 [IU]/mL
QuantiFERON Nil Value: 0.19 [IU]/mL
QuantiFERON TB1 Ag Value: 0.17 [IU]/mL
QuantiFERON TB2 Ag Value: 0.13 [IU]/mL

## 2023-10-20 LAB — GENECONNECT MOLECULAR SCREEN: Genetic Analysis Overall Interpretation: NEGATIVE

## 2023-11-09 ENCOUNTER — Ambulatory Visit (INDEPENDENT_AMBULATORY_CARE_PROVIDER_SITE_OTHER): Admitting: Family Medicine

## 2023-11-09 ENCOUNTER — Encounter: Payer: Self-pay | Admitting: Family Medicine

## 2023-11-09 VITALS — BP 133/80 | HR 81 | Temp 98.2°F | Ht 64.0 in | Wt 167.1 lb

## 2023-11-09 DIAGNOSIS — R42 Dizziness and giddiness: Secondary | ICD-10-CM | POA: Insufficient documentation

## 2023-11-09 DIAGNOSIS — I10 Essential (primary) hypertension: Secondary | ICD-10-CM

## 2023-11-09 DIAGNOSIS — H93A1 Pulsatile tinnitus, right ear: Secondary | ICD-10-CM

## 2023-11-09 DIAGNOSIS — Z1211 Encounter for screening for malignant neoplasm of colon: Secondary | ICD-10-CM

## 2023-11-09 MED ORDER — HYDROCHLOROTHIAZIDE 25 MG PO TABS: 12.5000 mg | ORAL_TABLET | Freq: Every day | ORAL | 1 refills | Status: AC

## 2023-11-09 MED ORDER — AMLODIPINE BESYLATE 10 MG PO TABS: 5.0000 mg | ORAL_TABLET | Freq: Every day | ORAL | Status: AC

## 2023-11-09 NOTE — Assessment & Plan Note (Signed)
 Hypertension Chronic hypertension, currently well-controlled with a blood pressure reading of 133/80 mmHg. Discussed the new guideline to maintain blood pressure under 130/80 mmHg. Initial dizziness with amlodipine  resolved after three days. Consideration of adding hydrochlorothiazide to optimize blood pressure control and assess its effect on episodic vertigo. Discussed potential side effects of hydrochlorothiazide, including potassium depletion, increased blood sugar, and muscle cramps. - restart  amlodipine  5 mg daily - Prescribe hydrochlorothiazide 12.5 mg daily - Order basic metabolic panel in two weeks to monitor potassium and kidney function - Instruct her to stay hydrated and monitor for potential side effects such as cramps, changes in potassium, blood sugar, and calcium levels

## 2023-11-09 NOTE — Patient Instructions (Addendum)
  Please visit the lab for blood work in 2 weeks.   You can visit the lab without an appointment Mon-Fri between 8A-11:30A or 1P-4:30P.     To keep you healthy, please keep in mind the following health maintenance items that you are due for:   Health Maintenance Due  Topic Date Due   Colonoscopy  Never done     Best Wishes,   Dr. Lang
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
## 2023-11-09 NOTE — Assessment & Plan Note (Signed)
 Episodic vertigo? Intermittent episodes of vertigo, particularly when turning head to the right. Symptoms include a sensation similar to visual illusions rather than full spinning. Possible association with blood pressure medication. Differential includes vertigo versus vascular issues. No bruits detected on examination. - Order carotid ultrasound to assess blood flow - Monitor symptoms with the addition of hydrochlorothiazide to determine if symptoms improve, indicating a possible vertigo diagnosis

## 2023-11-17 ENCOUNTER — Ambulatory Visit: Admission: RE | Admit: 2023-11-17 | Source: Ambulatory Visit

## 2023-12-12 ENCOUNTER — Ambulatory Visit

## 2024-01-17 ENCOUNTER — Encounter: Payer: Self-pay | Admitting: Family Medicine

## 2024-01-17 ENCOUNTER — Ambulatory Visit: Admitting: Family Medicine

## 2024-01-17 VITALS — BP 109/54 | HR 84 | Ht 64.0 in | Wt 170.6 lb

## 2024-01-17 DIAGNOSIS — R42 Dizziness and giddiness: Secondary | ICD-10-CM | POA: Diagnosis not present

## 2024-01-17 DIAGNOSIS — I10 Essential (primary) hypertension: Secondary | ICD-10-CM | POA: Diagnosis not present

## 2024-01-17 NOTE — Patient Instructions (Signed)
 To keep you healthy, please keep in mind the following health maintenance items that you are due for:   Health Maintenance Due  Topic Date Due   COVID-19 Vaccine (1 - 2025-26 season) Never done   Colonoscopy  Never done     Best Wishes,   Dr. Lang

## 2024-01-17 NOTE — Assessment & Plan Note (Signed)
 Resolved  Continue hydrochlorothiazide  12.5mg  daily

## 2024-01-17 NOTE — Progress Notes (Signed)
 Established patient visit   Patient: Cassandra Terrell   DOB: 1980-09-07   43 y.o. Female  MRN: [id number] Visit Date: 01/17/2024  Today's healthcare provider: Rockie Agent, MD   Chief Complaint  Patient presents with   Medical Management of Chronic Issues   Hypertension    She was last seen for hypertension 9 weeks ago. BP at that visit was 133/80. Management since that visit includes restart  amlodipine  5 mg daily, start hydrochlorothiazide  12.5 mg daily and check basic metabolic panel in two weeks to monitor potassium and kidney function. She reports excellent compliance with treatment. She is not having side effects. She is following a Low Sodium diet. She is exercising on occasions. She does not smoke. Symptoms: none. Imaging not completed due to cost.     Care Management    Colorectal Screening - Gastro Referral placed on 11/09/23 but has not been contacted for scheduling   Subjective     HPI     Hypertension    Additional comments: She was last seen for hypertension 9 weeks ago. BP at that visit was 133/80. Management since that visit includes restart  amlodipine  5 mg daily, start hydrochlorothiazide  12.5 mg daily and check basic metabolic panel in two weeks to monitor potassium and kidney function. She reports excellent compliance with treatment. She is not having side effects. She is following a Low Sodium diet. She is exercising on occasions. She does not smoke. Symptoms: none. Imaging not completed due to cost.          Care Management    Additional comments: Colorectal Screening - Gastro Referral placed on 11/09/23 but has not been contacted for scheduling      Last edited by Lilian Fitzpatrick, CMA on 01/17/2024  1:29 PM.       Discussed the use of AI scribe software for clinical note transcription with the patient, who gave verbal consent to proceed.  History of Present Illness Cassandra Terrell is a 43 year old female with hypertension who presents  for a follow-up visit.  She has hypertension and is currently taking hydrochlorothiazide  12.5 mg daily and amlodipine  5 mg daily. No dizziness has been experienced since starting hydrochlorothiazide .  She is due for a colonoscopy for colon cancer screening but canceled her scheduled procedure due to cost and plans to reschedule in the future.  Her current medications include an oral contraceptive, LARIN 24 FE, at a dose of 1-20 mg-micrograms daily, and a medication at a dose of 2 and 10 mg for a history of fibroids.  She plans to join a gym with friends in January to focus on getting healthy, while her friends are aiming to lose weight. She has been walking with friends as part of this effort.  She shares a family history of vertigo, with her mother and aunt affected, and she previously experienced dizziness before starting hydrochlorothiazide .     Past Medical History:  Diagnosis Date   Abnormal Pap smear of cervix 04-20-12   ASCUS +HPV HR   Anxiety    Fibroid    Hemorrhoids during pregnancy    Lichen sclerosus     Medications: Outpatient Medications Prior to Visit  Medication Sig   amLODipine  (NORVASC ) 10 MG tablet Take 0.5 tablets (5 mg total) by mouth daily.   cetirizine (ZYRTEC) 10 MG tablet Take 10 mg by mouth daily.   hydrochlorothiazide  (HYDRODIURIL ) 25 MG tablet Take 0.5 tablets (12.5 mg total) by mouth daily. Take one tablet by mouth  once daily   LARIN 24 FE 1-20 MG-MCG(24) tablet Take 1 tablet by mouth daily.   No facility-administered medications prior to visit.    Review of Systems      Objective    BP (!) 109/54 (BP Location: Left Arm, Patient Position: Sitting, Cuff Size: Normal)   Pulse 84   Ht 5' 4 (1.626 m)   Wt 170 lb 9.6 oz (77.4 kg)   SpO2 100%   BMI 29.28 kg/m      Physical Exam  Physical Exam MEASUREMENTS: Height- 96%. CHEST: Clear to auscultation bilaterally, no wheezes, rhonchi, or crackles. CARDIOVASCULAR: Normal heart rate and  rhythm, S1 and S2 normal without murmurs.    No results found for any visits on 01/17/24.  Assessment & Plan     Problem List Items Addressed This Visit     Episode of dizziness   Resolved  Continue hydrochlorothiazide  12.5mg  daily       HTN (hypertension) - Primary   Primary hypertension Chronic condition  Hypertension is well-controlled with current medication regimen. No dizziness reported since starting hydrochlorothiazide . Blood pressure readings are within normal range. - Continue amlodipine  5 mg daily. - Continue hydrochlorothiazide  12.5 mg daily       Assessment and Plan Assessment & Plan .  General health maintenance Colonoscopy referral is pending due to issues with the GI office. Alternative colorectal cancer screening with stool test provided to cover for 2025. - Will follow up with referral coordinator regarding colonoscopy referral. - Complete stool test for colorectal cancer screening and return to office.     Return in about 3 months (around 04/16/2024) for CPE.         Rockie Agent, MD  [facility] [phone] (phone) [phone] (fax)  [facility]

## 2024-01-17 NOTE — Progress Notes (Deleted)
 Hypertension, follow-up  BP Readings from Last 3 Encounters:  11/09/23 133/80  10/10/23 138/82  03/29/23 134/88   Wt Readings from Last 3 Encounters:  11/09/23 167 lb 1.6 oz (75.8 kg)  10/10/23 162 lb (73.5 kg)  03/29/23 161 lb (73 kg)     She was last seen for hypertension 9 weeks ago.  BP at that visit was 133/80. Management since that visit includes restart  amlodipine  5 mg daily, start hydrochlorothiazide  12.5 mg daily and check basic metabolic panel in two weeks to monitor potassium and kidney function. She reports {excellent/good/fair/poor:19665} compliance with treatment. She {is/is not:9024} having side effects. {document side effects if present:1} She is following a {diet:21022986} diet. She {is/is not:9024} exercising. She {does/does not:200015} smoke. Use of agents associated with hypertension: {bp agents assoc with hypertension:511::none}.  Outside blood pressures are {***enter patient reported home BP readings, or 'not being checked':1}. Symptoms: {Yes/No:20286} chest pain {Yes/No:20286} chest pressure  {Yes/No:20286} palpitations {Yes/No:20286} syncope  {Yes/No:20286} dyspnea {Yes/No:20286} orthopnea  {Yes/No:20286} paroxysmal nocturnal dyspnea {Yes/No:20286} lower extremity edema

## 2024-01-17 NOTE — Assessment & Plan Note (Signed)
 Primary hypertension Chronic condition  Hypertension is well-controlled with current medication regimen. No dizziness reported since starting hydrochlorothiazide . Blood pressure readings are within normal range. - Continue amlodipine  5 mg daily. - Continue hydrochlorothiazide  12.5 mg daily

## 2024-02-06 ENCOUNTER — Other Ambulatory Visit (INDEPENDENT_AMBULATORY_CARE_PROVIDER_SITE_OTHER)

## 2024-02-06 DIAGNOSIS — Z1211 Encounter for screening for malignant neoplasm of colon: Secondary | ICD-10-CM

## 2024-02-06 LAB — IFOBT (OCCULT BLOOD): IFOBT: NEGATIVE

## 2024-02-09 ENCOUNTER — Ambulatory Visit: Payer: Self-pay | Admitting: Family Medicine

## 2024-04-16 ENCOUNTER — Encounter: Admitting: Family Medicine
